# Patient Record
Sex: Male | Born: 1985 | Race: Black or African American | Hispanic: No | Marital: Single | Smoking: Never smoker
Health system: Southern US, Community
[De-identification: ages and names within clinical notes are randomized; demographics above are authoritative.]

## PROBLEM LIST (undated history)

## (undated) DIAGNOSIS — N2889 Other specified disorders of kidney and ureter: Secondary | ICD-10-CM

## (undated) DIAGNOSIS — M109 Gout, unspecified: Secondary | ICD-10-CM

## (undated) DIAGNOSIS — Z87442 Personal history of urinary calculi: Secondary | ICD-10-CM

## (undated) DIAGNOSIS — J069 Acute upper respiratory infection, unspecified: Secondary | ICD-10-CM

## (undated) DIAGNOSIS — R030 Elevated blood-pressure reading, without diagnosis of hypertension: Secondary | ICD-10-CM

## (undated) HISTORY — PX: APPENDECTOMY: SHX54

---

## 2010-11-09 ENCOUNTER — Emergency Department (HOSPITAL_COMMUNITY)
Admission: EM | Admit: 2010-11-09 | Discharge: 2010-11-09 | Disposition: A | Discharge status: Home / Self Care | Payer: Self-pay | Attending: Emergency Medicine | Admitting: Emergency Medicine

## 2010-11-09 DIAGNOSIS — N342 Other urethritis: Secondary | ICD-10-CM | POA: Insufficient documentation

## 2010-11-09 DIAGNOSIS — R369 Urethral discharge, unspecified: Secondary | ICD-10-CM | POA: Insufficient documentation

## 2015-01-02 ENCOUNTER — Encounter (HOSPITAL_COMMUNITY): Payer: Self-pay | Admitting: Emergency Medicine

## 2015-01-02 ENCOUNTER — Emergency Department (HOSPITAL_COMMUNITY)
Admission: EM | Admit: 2015-01-02 | Discharge: 2015-01-03 | Disposition: A | Discharge status: Home / Self Care | Payer: Self-pay | Attending: Emergency Medicine | Admitting: Emergency Medicine

## 2015-01-02 ENCOUNTER — Emergency Department (HOSPITAL_COMMUNITY): Payer: Self-pay

## 2015-01-02 DIAGNOSIS — R200 Anesthesia of skin: Secondary | ICD-10-CM | POA: Insufficient documentation

## 2015-01-02 DIAGNOSIS — R61 Generalized hyperhidrosis: Secondary | ICD-10-CM | POA: Insufficient documentation

## 2015-01-02 DIAGNOSIS — E876 Hypokalemia: Secondary | ICD-10-CM | POA: Insufficient documentation

## 2015-01-02 DIAGNOSIS — R002 Palpitations: Secondary | ICD-10-CM | POA: Insufficient documentation

## 2015-01-02 DIAGNOSIS — T43615A Adverse effect of caffeine, initial encounter: Secondary | ICD-10-CM | POA: Insufficient documentation

## 2015-01-02 DIAGNOSIS — R0602 Shortness of breath: Secondary | ICD-10-CM | POA: Insufficient documentation

## 2015-01-02 DIAGNOSIS — R079 Chest pain, unspecified: Secondary | ICD-10-CM | POA: Insufficient documentation

## 2015-01-02 LAB — CBC
HEMATOCRIT: 40.7 % (ref 39.0–52.0)
HEMOGLOBIN: 14.5 g/dL (ref 13.0–17.0)
MCH: 30 pg (ref 26.0–34.0)
MCHC: 35.6 g/dL (ref 30.0–36.0)
MCV: 84.1 fL (ref 78.0–100.0)
Platelets: 216 10*3/uL (ref 150–400)
RBC: 4.84 MIL/uL (ref 4.22–5.81)
RDW: 12.1 % (ref 11.5–15.5)
WBC: 7.7 10*3/uL (ref 4.0–10.5)

## 2015-01-02 LAB — BASIC METABOLIC PANEL
ANION GAP: 14 (ref 5–15)
BUN: 25 mg/dL — AB (ref 6–20)
CHLORIDE: 106 mmol/L (ref 101–111)
CO2: 17 mmol/L — ABNORMAL LOW (ref 22–32)
Calcium: 9.4 mg/dL (ref 8.9–10.3)
Creatinine, Ser: 1.17 mg/dL (ref 0.61–1.24)
GFR calc Af Amer: >60 mL/min (ref >60)
GLUCOSE: 118 mg/dL — AB (ref 65–99)
POTASSIUM: 2.8 mmol/L — AB (ref 3.5–5.1)
SODIUM: 137 mmol/L (ref 135–145)

## 2015-01-02 LAB — I-STAT TROPONIN, ED: Troponin i, poc: 0 ng/mL (ref 0.00–0.08)

## 2015-01-02 MED ORDER — POTASSIUM CHLORIDE CRYS ER 20 MEQ PO TBCR: 40.0000 meq | EXTENDED_RELEASE_TABLET | Freq: Every day | ORAL | Status: DC

## 2015-01-02 MED ORDER — POTASSIUM CHLORIDE CRYS ER 20 MEQ PO TBCR
40.0000 meq | EXTENDED_RELEASE_TABLET | Freq: Once | ORAL | Status: AC
  Administered 2015-01-02: 40 meq via ORAL
  Filled 2015-01-02: qty 2

## 2015-01-02 MED ORDER — POTASSIUM CHLORIDE CRYS ER 20 MEQ PO TBCR: 20.0000 meq | EXTENDED_RELEASE_TABLET | Freq: Every day | ORAL | Status: DC

## 2015-01-02 MED ORDER — SODIUM CHLORIDE 0.9 % IV BOLUS (SEPSIS)
1000.0000 mL | Freq: Once | INTRAVENOUS | Status: AC
  Administered 2015-01-02: 1000 mL via INTRAVENOUS

## 2015-01-02 MED ORDER — LORAZEPAM 2 MG/ML IJ SOLN
1.0000 mg | Freq: Once | INTRAMUSCULAR | Status: AC
  Administered 2015-01-02: 1 mg via INTRAVENOUS
  Filled 2015-01-02: qty 1

## 2015-01-02 NOTE — ED Provider Notes (Signed)
CSN: [id number]     Arrival date & time 01/02/15  2114 History   First MD Initiated Contact with Patient 01/02/15 2126     Chief Complaint  Patient presents with  . Palpitations     (Consider location/radiation/quality/duration/timing/severity/associated sxs/prior Treatment) HPI  29 year old male presents with chest tightness/pressure as well as palpitations that woke him up about one hour ago. Patient states he has been hyperventilating and feels like he cannot get enough air. He thinks he is having a panic attack. He is having perioral numbness as well as numbness in all 4 extremities. He has taken 2 diet pills (Zantrex) for the first time today. He states they specifically do not have ephedrine. Has never taken diet pills like this before. No radiation of the pain. He has noticed mild diaphoresis but no nausea or vomiting. No prior past medical history. No history of smoking of family history of CAD. No leg swelling, surgeries or recent travel. No illicit drug use.  History reviewed. No pertinent past medical history. Past Surgical History  Procedure Laterality Date  . Appendectomy     Family History  Problem Relation Age of Onset  . Diabetes Father   . Hypertension Other    Social History  Substance Use Topics  . Smoking status: Never Smoker   . Smokeless tobacco: None  . Alcohol Use: Yes     Comment: occ    Review of Systems  Constitutional: Positive for diaphoresis.  Respiratory: Positive for shortness of breath.   Cardiovascular: Positive for chest pain and palpitations. Negative for leg swelling.  Gastrointestinal: Negative for nausea and vomiting.  Neurological: Positive for numbness.  All other systems reviewed and are negative.     Allergies  Review of patient's allergies indicates no known allergies.  Home Medications   Prior to Admission medications   Not on File   BP 144/69 mmHg  Pulse 73  Temp(Src) 97.3 F (36.3 C) (Oral)  Resp 13  SpO2  100% Physical Exam  Constitutional: He is oriented to person, place, and time. He appears well-developed and well-nourished.  HENT:  Head: Normocephalic and atraumatic.  Right Ear: External ear normal.  Left Ear: External ear normal.  Nose: Nose normal.  Eyes: Right eye exhibits no discharge. Left eye exhibits no discharge.  Neck: Neck supple.  Cardiovascular: Normal rate, regular rhythm, normal heart sounds and intact distal pulses.   No murmur heard. Pulmonary/Chest: Effort normal and breath sounds normal. He exhibits no tenderness.  Abdominal: Soft. He exhibits no distension. There is no tenderness.  Musculoskeletal: He exhibits no edema.  Neurological: He is alert and oriented to person, place, and time.  Skin: Skin is warm. He is diaphoretic.  Nursing note and vitals reviewed.   ED Course  Procedures (including critical care time) Labs Review Labs Reviewed  BASIC METABOLIC PANEL - Abnormal; Notable for the following:    Potassium 2.8 (*)    CO2 17 (*)    Glucose, Bld 118 (*)    BUN 25 (*)    All other components within normal limits  CBC  I-STAT TROPOININ, ED    Imaging Review Dg Chest 2 View  01/02/2015  CLINICAL DATA:  Chest tightness for 1 day EXAM: CHEST - 2 VIEW COMPARISON:  None. FINDINGS: The heart size and mediastinal contours are within normal limits. Both lungs are clear. The visualized skeletal structures are unremarkable. IMPRESSION: No active disease. Electronically Signed   By: Inez Catalina M.D.   On: 01/02/2015 22:19  I have personally reviewed and evaluated these images and lab results as part of my medical decision-making.   EKG Interpretation   Date/Time:  Tuesday January 02 2015 21:24:02 EDT Ventricular Rate:  76 PR Interval:  195 QRS Duration: 80 QT Interval:  393 QTC Calculation: 442 R Axis:   70 Text Interpretation:  Sinus rhythm LVH by voltage ST elev, probable normal  early repol pattern no reciprocal changes or acute ischemia No old  tracing  to compare Confirmed by [place]  MD, Ottilia Pippenger ([id number]) on 01/02/2015 9:29:36  PM      MDM   Final diagnoses:  Chest pain, unspecified chest pain type  Caffeine adverse reaction, initial encounter  Hypokalemia    Patient's symptoms of palpitations and chest pain seem to be due to the excessive caffeine in the diet pills he took today. No signs of acute ischemia and I have low suspicion of ACS. Will get a 2nd troponin, if negative, D/C home with PCP f/u. His diffuse numbness is likely from hyperventilation and his hypokalemia. Will replete orally. Discussed stopping the diet pills and discussed strict return precautions. Care to Dr. Randal Buba with 2nd troponin pending.    Sherwood Gambler, MD 01/02/15 [phone]

## 2015-01-02 NOTE — Discharge Instructions (Signed)
Your symptoms seem to be an adverse reaction to the Zantrex you took today. It has a very high caffeine level. There is no evidence of a heart attack, but if you develop recurrent or worsening symptoms return to the ER immediately for evaluation. Otherwise follow up with a primary care doctor like the clinic listed above.

## 2015-01-02 NOTE — ED Notes (Signed)
Pt states he woke up about 30 minutes ago with a heaviness in his chest  Pt states he became very anxious and was hyperventilating  Pt states his hands drew up and his face got drawn  Pt states he took 2 diet pills earlier today  Pt denies pain just a heaviness in his chest

## 2015-01-02 NOTE — ED Notes (Signed)
Patient transported to X-ray 

## 2015-01-03 LAB — I-STAT TROPONIN, ED: Troponin i, poc: 0 ng/mL (ref 0.00–0.08)

## 2018-02-28 ENCOUNTER — Other Ambulatory Visit: Payer: Self-pay

## 2018-02-28 ENCOUNTER — Encounter (HOSPITAL_COMMUNITY): Payer: Self-pay

## 2018-02-28 ENCOUNTER — Emergency Department (HOSPITAL_COMMUNITY)
Admission: EM | Admit: 2018-02-28 | Discharge: 2018-02-28 | Disposition: A | Discharge status: Home / Self Care | Payer: 59 | Attending: Emergency Medicine | Admitting: Emergency Medicine

## 2018-02-28 ENCOUNTER — Emergency Department (HOSPITAL_COMMUNITY): Payer: 59

## 2018-02-28 DIAGNOSIS — R319 Hematuria, unspecified: Secondary | ICD-10-CM | POA: Diagnosis not present

## 2018-02-28 DIAGNOSIS — R1031 Right lower quadrant pain: Secondary | ICD-10-CM | POA: Diagnosis present

## 2018-02-28 DIAGNOSIS — Z79899 Other long term (current) drug therapy: Secondary | ICD-10-CM | POA: Diagnosis not present

## 2018-02-28 LAB — CBC
HEMATOCRIT: 41.2 % (ref 39.0–52.0)
Hemoglobin: 13.2 g/dL (ref 13.0–17.0)
MCH: 29.5 pg (ref 26.0–34.0)
MCHC: 32 g/dL (ref 30.0–36.0)
MCV: 92.2 fL (ref 80.0–100.0)
NRBC: 0 % (ref 0.0–0.2)
Platelets: 237 10*3/uL (ref 150–400)
RBC: 4.47 MIL/uL (ref 4.22–5.81)
RDW: 12.4 % (ref 11.5–15.5)
WBC: 6.3 10*3/uL (ref 4.0–10.5)

## 2018-02-28 LAB — URINALYSIS, ROUTINE W REFLEX MICROSCOPIC
BILIRUBIN URINE: NEGATIVE
GLUCOSE, UA: NEGATIVE mg/dL
Ketones, ur: NEGATIVE mg/dL
LEUKOCYTES UA: NEGATIVE
NITRITE: NEGATIVE
Protein, ur: 30 mg/dL — AB
RBC / HPF: >50 RBC/hpf — ABNORMAL HIGH (ref 0–5)
Specific Gravity, Urine: 1.018 (ref 1.005–1.030)
pH: 5 (ref 5.0–8.0)

## 2018-02-28 LAB — COMPREHENSIVE METABOLIC PANEL
ALT: 34 U/L (ref 0–44)
ANION GAP: 8 (ref 5–15)
AST: 31 U/L (ref 15–41)
Albumin: 4.2 g/dL (ref 3.5–5.0)
Alkaline Phosphatase: 59 U/L (ref 38–126)
BUN: 17 mg/dL (ref 6–20)
CHLORIDE: 111 mmol/L (ref 98–111)
CO2: 24 mmol/L (ref 22–32)
Calcium: 9.1 mg/dL (ref 8.9–10.3)
Creatinine, Ser: 1.18 mg/dL (ref 0.61–1.24)
GFR calc non Af Amer: >60 mL/min (ref >60)
Glucose, Bld: 115 mg/dL — ABNORMAL HIGH (ref 70–99)
POTASSIUM: 4.5 mmol/L (ref 3.5–5.1)
Sodium: 143 mmol/L (ref 135–145)
Total Bilirubin: 0.4 mg/dL (ref 0.3–1.2)
Total Protein: 7.7 g/dL (ref 6.5–8.1)

## 2018-02-28 LAB — LIPASE, BLOOD: LIPASE: 32 U/L (ref 11–51)

## 2018-02-28 NOTE — ED Provider Notes (Signed)
[facility] DEPT Provider Note   CSN: [id number] Arrival date & time: 02/28/18  1016     History   Chief Complaint Chief Complaint  Patient presents with  . Abdominal Pain  . Flank Pain    HPI Richard Brandt is a 32 y.o. male.  HPI Patient is a 32 year old male who reports intermittent hematuria over the past 1 month.  He was seen in urgent care 1 month ago with left flank pain and hematuria and was told that he probably had a kidney stone.  He presents back to the ER today because of a burning sensation once yesterday while voiding and more importantly return of his hematuria which is described as urine that is grapefruit color in nature.  Intermittent flank pain.  No significant pain at this time.  No fevers or chills.  No nausea or vomiting.  Patient has not had any advanced imaging to this point.   History reviewed. No pertinent past medical history.  There are no active problems to display for this patient.   Past Surgical History:  Procedure Laterality Date  . APPENDECTOMY          Home Medications    Prior to Admission medications   Medication Sig Start Date End Date Taking? Authorizing Provider  aspirin-acetaminophen-caffeine (EXCEDRIN MIGRAINE) [phone] MG tablet Take 1 tablet by mouth every 6 (six) hours as needed for headache.   Yes [provider]  Coconut Oil 1000 MG CAPS Take 2,000 mg by mouth daily.   Yes [provider]  potassium chloride SA (K-DUR,KLOR-CON) 20 MEQ tablet Take 1 tablet (20 mEq total) by mouth daily. Patient not taking: Reported on 02/28/2018 01/02/15   Sherwood Gambler, MD    Family History Family History  Problem Relation Age of Onset  . Diabetes Father   . Hypertension Other     Social History Social History   Tobacco Use  . Smoking status: Never Smoker  . Smokeless tobacco: Never Used  Substance Use Topics  . Alcohol use: Yes    Comment: occ  . Drug use: No      Allergies   Patient has no known allergies.   Review of Systems Review of Systems  All other systems reviewed and are negative.    Physical Exam Updated Vital Signs BP (!) 150/92 (BP Location: Left Arm)   Pulse (!) 57   Temp 98.8 F (37.1 C) (Oral)   Resp 16   SpO2 97%   Physical Exam Vitals signs and nursing note reviewed.  Constitutional:      Appearance: He is well-developed.  HENT:     Head: Normocephalic.  Neck:     Musculoskeletal: Normal range of motion.  Pulmonary:     Effort: Pulmonary effort is normal.  Abdominal:     General: There is no distension.     Palpations: There is no mass.     Tenderness: There is no abdominal tenderness.  Musculoskeletal: Normal range of motion.  Neurological:     Mental Status: He is alert and oriented to person, place, and time.      ED Treatments / Results  Labs (all labs ordered are listed, but only abnormal results are displayed) Labs Reviewed  COMPREHENSIVE METABOLIC PANEL - Abnormal; Notable for the following components:      Result Value   Glucose, Bld 115 (*)    All other components within normal limits  URINALYSIS, ROUTINE W REFLEX MICROSCOPIC - Abnormal; Notable for the following components:  APPearance CLOUDY (*)    Hgb urine dipstick LARGE (*)    Protein, ur 30 (*)    RBC / HPF >50 (*)    Bacteria, UA RARE (*)    All other components within normal limits  LIPASE, BLOOD  CBC  URINALYSIS, ROUTINE W REFLEX MICROSCOPIC    EKG None  Radiology Ct Renal Stone Study  Result Date: 02/28/2018 CLINICAL DATA:  Intermittent right lower quadrant abdominal pain which started in the back and is now localized to the right abdomen. Intermittent hematuria yesterday. History of kidney stone. EXAM: CT ABDOMEN AND PELVIS WITHOUT CONTRAST TECHNIQUE: Multidetector CT imaging of the abdomen and pelvis was performed following the standard protocol without IV contrast. COMPARISON:  None. FINDINGS: Lower chest: Clear  lung bases. No significant pleural or pericardial effusion. Hepatobiliary: The liver appears unremarkable as imaged in the noncontrast state. The gallbladder is incompletely distended. No evidence of gallstones, gallbladder wall thickening or biliary dilatation. Pancreas: Unremarkable. No pancreatic ductal dilatation or surrounding inflammatory changes. Spleen: Normal in size without focal abnormality. Adrenals/Urinary Tract: Both adrenal glands appear normal. The right kidney appears unremarkable. There is a 10 mm calculus in the interpolar region of the left kidney, possibly within an infundibulum. There is another tiny calculus more inferiorly. In the lower pole of the left kidney, there is suspicion of a peripherally calcified lesion measuring 2.0 x 2.1 cm on image 25/2. Both ureters appear normal. The bladder is nearly empty and suboptimally evaluated. There are bilateral pelvic phleboliths. Stomach/Bowel: No evidence of bowel wall thickening, distention or surrounding inflammatory change. The appendix is not visualized, by report surgically absent. Mildly prominent stool throughout the colon. Vascular/Lymphatic: There are no enlarged abdominal or pelvic lymph nodes. No significant vascular findings on noncontrast imaging. Reproductive: The prostate gland and seminal vesicles appear normal. Other: Small umbilical hernia containing only fat.  No ascites. Musculoskeletal: No acute or significant osseous findings. IMPRESSION: 1. No acute findings or explanation for the patient's symptoms. 2. Left renal calculi. No evidence of ureteral calculus or hydronephrosis. 3. Indeterminate, suspected partially calcified lesion in the lower pole of the left kidney, incompletely characterized by this noncontrast study. This could reflect a complicated cyst or solid mass. Urology consultation/follow-up recommended. Further evaluation of this lesion would be best obtained with elective renal MRI to include pre and post-contrast  imaging. Alternatively, dedicated renal CT could be performed. 4. Previous appendectomy. Electronically Signed   By: Richardean Sale M.D.   On: 02/28/2018 15:12    Procedures Procedures (including critical care time)  Medications Ordered in ED Medications - No data to display   Initial Impression / Assessment and Plan / ED Course  I have reviewed the triage vital signs and the nursing notes.  Pertinent labs & imaging results that were available during my care of the patient were reviewed by me and considered in my medical decision making (see chart for details).     Calcified left renal mass found on noncontrasted CT.  This could be the cause of the patient's intermittent hematuria over the past month.  He will need outpatient urology follow-up.  He understands to call the office tomorrow morning for close follow-up.  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  Spouse is with the patient as well and all questions were answered.  I do not think he needs additional work-up at this time.  Final Clinical Impressions(s) / ED Diagnoses   Final diagnoses:  Hematuria, unspecified  type    ED Discharge Orders    None       Jola Schmidt, MD 02/28/18 [phone]

## 2018-02-28 NOTE — ED Notes (Signed)
Bed: WA08 Expected date:  Expected time:  Means of arrival:  Comments: 

## 2018-02-28 NOTE — Discharge Instructions (Addendum)
It is VERY important that you follow up with the Urology team for additional workup

## 2018-02-28 NOTE — ED Triage Notes (Signed)
Patient c/o intermittent sore pain in right lower abdomen that started in right lower back and radiated to front. Now pinpoint in right lower abdomen intermitten. Patient noticed blood in urine yesterday intermittent. Patient had a kidney stone about 1 month ago and was seen at urgent care. Patient was able to pass the stone. Patient also has blood in urine a month ago that resolved. Denies n/v or diarrhea. Patient c/o of a burning sensation once yesterday while voiding.   A/Ox4 Ambulatory in triage.   0/10 pain in triage intermittent pain 2/10

## 2018-04-06 ENCOUNTER — Other Ambulatory Visit: Payer: Self-pay | Admitting: Urology

## 2018-04-15 ENCOUNTER — Encounter (HOSPITAL_BASED_OUTPATIENT_CLINIC_OR_DEPARTMENT_OTHER): Payer: Self-pay | Admitting: *Deleted

## 2018-04-15 ENCOUNTER — Other Ambulatory Visit: Payer: Self-pay

## 2018-04-15 NOTE — Progress Notes (Signed)
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

## 2018-04-22 MED ORDER — GENTAMICIN SULFATE 40 MG/ML IJ SOLN
560.0000 mg | INTRAVENOUS | Status: AC
  Administered 2018-04-23: 560 mg via INTRAVENOUS
  Filled 2018-04-22 (×3): qty 14

## 2018-04-22 NOTE — Anesthesia Preprocedure Evaluation (Addendum)
Anesthesia Evaluation  Patient identified by MRN, date of birth, ID band Patient awake    Reviewed: Allergy & Precautions, NPO status , Patient's Chart, lab work & pertinent test results  Airway Mallampati: II  TM Distance: >3 FB Neck ROM: Full    Dental no notable dental hx. (+) Teeth Intact, Dental Advisory Given   Pulmonary neg pulmonary ROS,    Pulmonary exam normal breath sounds clear to auscultation       Cardiovascular Exercise Tolerance: Good negative cardio ROS Normal cardiovascular exam Rhythm:Regular Rate:Normal     Neuro/Psych negative neurological ROS  negative psych ROS   GI/Hepatic negative GI ROS,   Endo/Other    Renal/GU nephrolithiasis     Musculoskeletal   Abdominal   Peds  Hematology   Anesthesia Other Findings   Reproductive/Obstetrics                            Anesthesia Physical Anesthesia Plan  ASA: I  Anesthesia Plan: General   Post-op Pain Management:    Induction: Intravenous  PONV Risk Score and Plan: 2 and Treatment may vary due to age or medical condition, Ondansetron and Dexamethasone  Airway Management Planned: LMA  Additional Equipment:   Intra-op Plan:   Post-operative Plan:   Informed Consent: I have reviewed the patients History and Physical, chart, labs and discussed the procedure including the risks, benefits and alternatives for the proposed anesthesia with the patient or authorized representative who has indicated his/her understanding and acceptance.     Dental advisory given  Plan Discussed with:   Anesthesia Plan Comments: (Cysto w stent L)        Anesthesia Quick Evaluation

## 2018-04-23 ENCOUNTER — Encounter (HOSPITAL_BASED_OUTPATIENT_CLINIC_OR_DEPARTMENT_OTHER): Payer: Self-pay

## 2018-04-23 ENCOUNTER — Encounter (HOSPITAL_BASED_OUTPATIENT_CLINIC_OR_DEPARTMENT_OTHER)
Admission: RE | Disposition: A | Discharge status: Home / Self Care | Payer: Self-pay | Source: Home / Self Care | Attending: Urology

## 2018-04-23 ENCOUNTER — Ambulatory Visit (HOSPITAL_BASED_OUTPATIENT_CLINIC_OR_DEPARTMENT_OTHER)
Admission: RE | Admit: 2018-04-23 | Discharge: 2018-04-23 | Disposition: A | Discharge status: Home / Self Care | Payer: 59 | Attending: Urology | Admitting: Urology

## 2018-04-23 ENCOUNTER — Ambulatory Visit (HOSPITAL_BASED_OUTPATIENT_CLINIC_OR_DEPARTMENT_OTHER): Payer: 59 | Admitting: Anesthesiology

## 2018-04-23 DIAGNOSIS — Z7982 Long term (current) use of aspirin: Secondary | ICD-10-CM | POA: Diagnosis not present

## 2018-04-23 DIAGNOSIS — N2889 Other specified disorders of kidney and ureter: Secondary | ICD-10-CM | POA: Insufficient documentation

## 2018-04-23 DIAGNOSIS — Z87442 Personal history of urinary calculi: Secondary | ICD-10-CM | POA: Insufficient documentation

## 2018-04-23 DIAGNOSIS — Z833 Family history of diabetes mellitus: Secondary | ICD-10-CM | POA: Diagnosis not present

## 2018-04-23 DIAGNOSIS — N200 Calculus of kidney: Secondary | ICD-10-CM | POA: Diagnosis present

## 2018-04-23 DIAGNOSIS — Z8249 Family history of ischemic heart disease and other diseases of the circulatory system: Secondary | ICD-10-CM | POA: Diagnosis not present

## 2018-04-23 HISTORY — DX: Personal history of urinary calculi: Z87.442

## 2018-04-23 HISTORY — PX: CYSTOSCOPY WITH RETROGRADE PYELOGRAM, URETEROSCOPY AND STENT PLACEMENT: SHX5789

## 2018-04-23 HISTORY — DX: Acute upper respiratory infection, unspecified: J06.9

## 2018-04-23 HISTORY — PX: HOLMIUM LASER APPLICATION: SHX5852

## 2018-04-23 LAB — BASIC METABOLIC PANEL
Anion gap: 11 (ref 5–15)
BUN: 18 mg/dL (ref 6–20)
CO2: 26 mmol/L (ref 22–32)
Calcium: 9 mg/dL (ref 8.9–10.3)
Chloride: 103 mmol/L (ref 98–111)
Creatinine, Ser: 1.04 mg/dL (ref 0.61–1.24)
GFR calc Af Amer: >60 mL/min (ref >60)
GFR calc non Af Amer: >60 mL/min (ref >60)
Glucose, Bld: 92 mg/dL (ref 70–99)
POTASSIUM: 3.6 mmol/L (ref 3.5–5.1)
Sodium: 140 mmol/L (ref 135–145)

## 2018-04-23 SURGERY — CYSTOURETEROSCOPY, WITH RETROGRADE PYELOGRAM AND STENT INSERTION
Anesthesia: General | Site: Renal | Laterality: Left

## 2018-04-23 MED ORDER — ONDANSETRON HCL 4 MG/2ML IJ SOLN
4.0000 mg | Freq: Once | INTRAMUSCULAR | Status: DC | PRN
  Filled 2018-04-23: qty 2

## 2018-04-23 MED ORDER — CEFAZOLIN SODIUM-DEXTROSE 1-4 GM/50ML-% IV SOLN
INTRAVENOUS | Status: AC
  Filled 2018-04-23: qty 50

## 2018-04-23 MED ORDER — ONDANSETRON HCL 4 MG/2ML IJ SOLN
INTRAMUSCULAR | Status: DC | PRN
  Administered 2018-04-23: 4 mg via INTRAVENOUS

## 2018-04-23 MED ORDER — IOHEXOL 300 MG/ML  SOLN
INTRAMUSCULAR | Status: DC | PRN
  Administered 2018-04-23: 20 mL

## 2018-04-23 MED ORDER — LIDOCAINE 2% (20 MG/ML) 5 ML SYRINGE
INTRAMUSCULAR | Status: AC
  Filled 2018-04-23: qty 5

## 2018-04-23 MED ORDER — LACTATED RINGERS IV SOLN
INTRAVENOUS | Status: DC
  Administered 2018-04-23 (×2): via INTRAVENOUS
  Filled 2018-04-23: qty 1000

## 2018-04-23 MED ORDER — ACETAMINOPHEN 500 MG PO TABS
ORAL_TABLET | ORAL | Status: AC
  Filled 2018-04-23: qty 2

## 2018-04-23 MED ORDER — KETOROLAC TROMETHAMINE 30 MG/ML IJ SOLN
INTRAMUSCULAR | Status: AC
  Filled 2018-04-23: qty 1

## 2018-04-23 MED ORDER — DEXAMETHASONE SODIUM PHOSPHATE 10 MG/ML IJ SOLN
INTRAMUSCULAR | Status: AC
  Filled 2018-04-23: qty 1

## 2018-04-23 MED ORDER — SODIUM CHLORIDE 0.9 % IR SOLN
Status: DC | PRN
  Administered 2018-04-23: 3000 mL

## 2018-04-23 MED ORDER — KETOROLAC TROMETHAMINE 10 MG PO TABS: 10.0000 mg | ORAL_TABLET | Freq: Three times a day (TID) | ORAL | 0 refills | Status: DC | PRN

## 2018-04-23 MED ORDER — CEPHALEXIN 500 MG PO CAPS: 500.0000 mg | ORAL_CAPSULE | Freq: Two times a day (BID) | ORAL | 0 refills | Status: DC

## 2018-04-23 MED ORDER — MIDAZOLAM HCL 2 MG/2ML IJ SOLN
INTRAMUSCULAR | Status: AC
  Filled 2018-04-23: qty 2

## 2018-04-23 MED ORDER — LIDOCAINE 2% (20 MG/ML) 5 ML SYRINGE
INTRAMUSCULAR | Status: DC | PRN
  Administered 2018-04-23: 100 mg via INTRAVENOUS

## 2018-04-23 MED ORDER — PROPOFOL 10 MG/ML IV BOLUS
INTRAVENOUS | Status: DC | PRN
  Administered 2018-04-23: 200 mg via INTRAVENOUS

## 2018-04-23 MED ORDER — PROPOFOL 10 MG/ML IV BOLUS
INTRAVENOUS | Status: AC
  Filled 2018-04-23: qty 20

## 2018-04-23 MED ORDER — ACETAMINOPHEN 500 MG PO TABS
1000.0000 mg | ORAL_TABLET | Freq: Once | ORAL | Status: AC
  Administered 2018-04-23: 1000 mg via ORAL
  Filled 2018-04-23: qty 2

## 2018-04-23 MED ORDER — FENTANYL CITRATE (PF) 100 MCG/2ML IJ SOLN
INTRAMUSCULAR | Status: DC | PRN
  Administered 2018-04-23 (×4): 50 ug via INTRAVENOUS

## 2018-04-23 MED ORDER — DEXAMETHASONE SODIUM PHOSPHATE 4 MG/ML IJ SOLN
INTRAMUSCULAR | Status: DC | PRN
  Administered 2018-04-23: 10 mg via INTRAVENOUS

## 2018-04-23 MED ORDER — SENNOSIDES-DOCUSATE SODIUM 8.6-50 MG PO TABS: 1.0000 | ORAL_TABLET | Freq: Two times a day (BID) | ORAL | 0 refills | Status: AC

## 2018-04-23 MED ORDER — KETOROLAC TROMETHAMINE 30 MG/ML IJ SOLN
INTRAMUSCULAR | Status: DC | PRN
  Administered 2018-04-23: 30 mg via INTRAVENOUS

## 2018-04-23 MED ORDER — FENTANYL CITRATE (PF) 100 MCG/2ML IJ SOLN
INTRAMUSCULAR | Status: AC
  Filled 2018-04-23: qty 2

## 2018-04-23 MED ORDER — KETOROLAC TROMETHAMINE 30 MG/ML IJ SOLN
30.0000 mg | Freq: Once | INTRAMUSCULAR | Status: DC | PRN
  Filled 2018-04-23: qty 1

## 2018-04-23 MED ORDER — ONDANSETRON HCL 4 MG/2ML IJ SOLN
INTRAMUSCULAR | Status: AC
  Filled 2018-04-23: qty 2

## 2018-04-23 MED ORDER — OXYCODONE-ACETAMINOPHEN 5-325 MG PO TABS: 1.0000 | ORAL_TABLET | Freq: Four times a day (QID) | ORAL | 0 refills | Status: DC | PRN

## 2018-04-23 MED ORDER — CEFAZOLIN SODIUM-DEXTROSE 1-4 GM/50ML-% IV SOLN
INTRAVENOUS | Status: DC | PRN
  Administered 2018-04-23: 1 g via INTRAVENOUS

## 2018-04-23 MED ORDER — HYDROCODONE-ACETAMINOPHEN 7.5-325 MG PO TABS
1.0000 | ORAL_TABLET | Freq: Once | ORAL | Status: DC | PRN
  Filled 2018-04-23: qty 1

## 2018-04-23 MED ORDER — MEPERIDINE HCL 25 MG/ML IJ SOLN
6.2500 mg | INTRAMUSCULAR | Status: DC | PRN
  Filled 2018-04-23: qty 1

## 2018-04-23 MED ORDER — MIDAZOLAM HCL 5 MG/5ML IJ SOLN
INTRAMUSCULAR | Status: DC | PRN
  Administered 2018-04-23: 2 mg via INTRAVENOUS

## 2018-04-23 MED ORDER — HYDROMORPHONE HCL 1 MG/ML IJ SOLN
INTRAMUSCULAR | Status: AC
  Filled 2018-04-23: qty 1

## 2018-04-23 MED ORDER — HYDROMORPHONE HCL 1 MG/ML IJ SOLN
0.2500 mg | INTRAMUSCULAR | Status: DC | PRN
  Administered 2018-04-23 (×2): 0.5 mg via INTRAVENOUS
  Filled 2018-04-23: qty 0.5

## 2018-04-23 SURGICAL SUPPLY — 28 items
BAG DRAIN URO-CYSTO SKYTR STRL (DRAIN) ×3 IMPLANT
BASKET LASER NITINOL 1.9FR (BASKET) ×2 IMPLANT
CATH INTERMIT  6FR 70CM (CATHETERS) IMPLANT
CLOTH BEACON ORANGE TIMEOUT ST (SAFETY) ×3 IMPLANT
FIBER LASER FLEXIVA 365 (UROLOGICAL SUPPLIES) IMPLANT
FIBER LASER TRAC TIP (UROLOGICAL SUPPLIES) ×2 IMPLANT
GLOVE BIO SURGEON STRL SZ7.5 (GLOVE) ×3 IMPLANT
GLOVE BIOGEL PI IND STRL 8.5 (GLOVE) IMPLANT
GLOVE BIOGEL PI INDICATOR 8.5 (GLOVE) ×4
GLOVE INDICATOR 8.5 STRL (GLOVE) ×2 IMPLANT
GOWN STRL REUS W/TWL LRG LVL3 (GOWN DISPOSABLE) ×5 IMPLANT
GUIDEWIRE ANG ZIPWIRE 038X150 (WIRE) ×3 IMPLANT
GUIDEWIRE STR DUAL SENSOR (WIRE) ×3 IMPLANT
INFUSOR MANOMETER BAG 3000ML (MISCELLANEOUS) ×3 IMPLANT
IV NS 1000ML (IV SOLUTION)
IV NS 1000ML BAXH (IV SOLUTION) ×1 IMPLANT
IV NS IRRIG 3000ML ARTHROMATIC (IV SOLUTION) ×3 IMPLANT
KIT TURNOVER CYSTO (KITS) ×3 IMPLANT
MANIFOLD NEPTUNE II (INSTRUMENTS) ×3 IMPLANT
NS IRRIG 500ML POUR BTL (IV SOLUTION) ×4 IMPLANT
PACK CYSTO (CUSTOM PROCEDURE TRAY) ×3 IMPLANT
SHEATH URET ACCESS 12FR/35CM (UROLOGICAL SUPPLIES) ×2 IMPLANT
STENT POLARIS 5FRX28 (STENTS) ×2 IMPLANT
SYR 10ML LL (SYRINGE) ×3 IMPLANT
TUBE CONNECTING 12'X1/4 (SUCTIONS)
TUBE CONNECTING 12X1/4 (SUCTIONS) IMPLANT
TUBE FEEDING 8FR 16IN STR KANG (MISCELLANEOUS) IMPLANT
TUBING UROLOGY SET (TUBING) ×2 IMPLANT

## 2018-04-23 NOTE — Brief Op Note (Deleted)
04/23/2018  11:48 AM  PATIENT:  Cleatrice Burke  33 y.o. male  PRE-OPERATIVE DIAGNOSIS:  LEFT RENAL STONES  POST-OPERATIVE DIAGNOSIS:  LEFT RENAL STONES  PROCEDURE:  Procedure(s): CYSTOSCOPY WITH RETROGRADE PYELOGRAM, URETEROSCOPY AND STENT PLACEMENT (Left) HOLMIUM LASER APPLICATION (Left)  SURGEON:  Surgeon(s) and Role:    Alexis Frock, MD - Primary  PHYSICIAN ASSISTANT:   ASSISTANTS: Case Wood MD   ANESTHESIA:   local and general  EBL:  0 mL   BLOOD ADMINISTERED:none  DRAINS: Penrose drain in the dependant scrotum   LOCAL MEDICATIONS USED:  MARCAINE     SPECIMEN:  No Specimen  DISPOSITION OF SPECIMEN:  N/A  COUNTS:  YES  TOURNIQUET:  * No tourniquets in log *  DICTATION: .Other Dictation: Dictation Number [id number]  PLAN OF CARE: Discharge to home after PACU  PATIENT DISPOSITION:  PACU - hemodynamically stable.   Delay start of Pharmacological VTE agent (>24hrs) due to surgical blood loss or risk of bleeding: yes

## 2018-04-23 NOTE — Transfer of Care (Signed)
  Last Vitals:  Vitals Value Taken Time  BP 143/90 04/23/2018 10:51 AM  Temp    Pulse 79 04/23/2018 10:54 AM  Resp 16 04/23/2018 10:54 AM  SpO2 100 % 04/23/2018 10:54 AM  Vitals shown include unvalidated device data.  Last Pain:  Vitals:   04/23/18 4270  TempSrc:   PainSc: 0-No pain      Patients Stated Pain Goal: 3 (04/23/18 6237)  Immediate Anesthesia Transfer of Care Note  Patient: Richard Brandt  Procedure(s) Performed: Procedure(s) (LRB): CYSTOSCOPY WITH RETROGRADE PYELOGRAM, URETEROSCOPY AND STENT PLACEMENT (Left) HOLMIUM LASER APPLICATION (Left)  Patient Location: PACU  Anesthesia Type: General  Level of Consciousness: awake, alert  and oriented  Airway & Oxygen Therapy: Patient Spontanous Breathing and Patient connected to nasal cannula oxygen  Post-op Assessment: Report given to PACU RN and Post -op Vital signs reviewed and stable  Post vital signs: Reviewed and stable  Complications: No apparent anesthesia complications

## 2018-04-23 NOTE — Op Note (Signed)
NAMEFINNEUS, Richard Brandt MEDICAL RECORD [id number] ACCOUNT [id number] DATE OF BIRTH:[birth year] FACILITY: [facility] LOCATION: [facility] PHYSICIAN:Bonnye Halle, MD  OPERATIVE REPORT  DATE OF PROCEDURE:  04/23/2018  PREOPERATIVE DIAGNOSIS:  Left renal stones, left renal mass.  PROCEDURE: 1.  Cystoscopy, left retrograde pyelogram, interpretation. 2.  Left ureteroscopy with laser lithotripsy. 3.  Insertion of left ureteral stent 5 x 28 Polaris with tether.  ESTIMATED BLOOD LOSS:  Nil.  COMPLICATIONS:  None.  SPECIMENS:  Left renal stone fragments for analysis.  FINDINGS: 1.  Multifocal left renal stones, dominant fragment approximately 8 mm. 2.  Complete resolution of all accessible stone fragments larger than one-third mm following laser lithotripsy and basket extraction. 3.  Successful placement of left ureteral stent proximal end renal pelvis, distal end in urinary bladder.  INDICATIONS:  The patient is a pleasant 33 year old man who was found on workup of gross hematuria to have multifocal left renal stones as well as a concerning left lower pole renal mass.  We discussed management options and are on a path towards  addressing his stones as well as left renal mass with ureteroscopy today in order to render stone free before planned partial nephrectomy likely in several months.  The goal of this is to render him stone free partial nephrectomy, so that he does not  have acute obstruction in the setting of a new renal repair.  Informed consent was obtained and placed in medical record.  DESCRIPTION OF PROCEDURE:  The patient was identified.  Procedure being left ureteroscopic stimulation was confirmed.  Procedure timeout was performed.  Intravenous antibiotics administered.  General anesthesia induced.  The patient was placed into a low  lithotomy position, sterile field was created prepped and draped base of the penis, perineum and proximal thighs using iodine.  Cystourethroscopy was  then performed using a 22-French rigid cystoscope vessel lens.  Inspection of the anterior and  posterior urethra was unremarkable.  Inspection of the urinary bladder no diverticula, calcifications, papillary lesions.  The left ureteral orifice was cannulated with a 6-French renal catheter and left retrograde pyelogram was obtained.  Left retrograde pyelogram demonstrates a single left ureter single system of the kidney.  There were no filling defects or narrowing noted.  A 0.03 ZIPwire was advanced to lower pole and set aside as a safety wire.  An 8-French feeding tube was placed in  the urinary bladder pressure released and semi-rigid ureteroscopy was performed of the distal 2/3 left ureter alongside a separate sensor working wire.  No mucosal abnormalities were found.  The semirigid scope was exchanged for a 12/14 medium length  ureteral access sheath to the level of the proximal ureter using continuous fluoroscopic guidance and flexible digital ureteroscopy performed the proximal left ureter and left kidney, including all calices x3.  There was multifocal intrarenal stone.   There was a dominant calcification approximately 8 mm in the area of the renal pelvis and UPJ.  This was retrograde positioned into the upper pole calix allowing for minimal angulation for stone manipulation.  There was another smaller stone  approximately 3 mm that was amenable to simple basketing.  It was grasped with an escape basket, removed and set aside.  Holmium laser energy was then applied to the dominant stone using settings of 0.2 joules and 20 Hz, fragmented into 4 smaller pieces  that were then sequentially grasped with the long axis, removed and set aside for composition analysis.  Following this there was excellent hemostasis.  No evidence of renal perforation.  There was complete resolution of all accessible stone fragments  larger than one-third mm that.  The access sheath has been under continuous vision.  No  significant mucosal abnormalities or edema noted.  As such, a new 5 x 28 Polaris-type stent was placed with remaining safety wire using fluoroscopic guidance.  Good  proximal and distal plane were noted.  Tether was left in place fashioned to the dorsum of the penis.  The patient tolerated the procedure well.  No immediate perioperative complications.  The patient was taken to postanesthesia care in stable condition.  TN/NUANCE  D:04/23/2018 T:04/23/2018 JOB:[id number]

## 2018-04-23 NOTE — Anesthesia Postprocedure Evaluation (Signed)
Anesthesia Post Note  Patient: Richard Brandt  Procedure(s) Performed: CYSTOSCOPY WITH RETROGRADE PYELOGRAM, URETEROSCOPY AND STENT PLACEMENT (Left Renal) HOLMIUM LASER APPLICATION (Left Renal)     Patient location during evaluation: PACU Anesthesia Type: General Level of consciousness: awake and alert Pain management: pain level controlled Vital Signs Assessment: post-procedure vital signs reviewed and stable Respiratory status: spontaneous breathing, nonlabored ventilation, respiratory function stable and patient connected to nasal cannula oxygen Cardiovascular status: blood pressure returned to baseline and stable Postop Assessment: no apparent nausea or vomiting Anesthetic complications: no    Last Vitals:  Vitals:   04/23/18 1115 04/23/18 1130  BP: (!) 151/88 139/82  Pulse: 69 64  Resp: 13 11  Temp:    SpO2: 97% 100%    Last Pain:  Vitals:   04/23/18 1131  TempSrc:   PainSc: 5                  Barnet Glasgow

## 2018-04-23 NOTE — Brief Op Note (Signed)
04/23/2018  10:41 AM  PATIENT:  Cleatrice Burke  33 y.o. male  PRE-OPERATIVE DIAGNOSIS:  LEFT RENAL STONES  POST-OPERATIVE DIAGNOSIS:  LEFT RENAL STONES  PROCEDURE:  Procedure(s): CYSTOSCOPY WITH RETROGRADE PYELOGRAM, URETEROSCOPY AND STENT PLACEMENT (Left) HOLMIUM LASER APPLICATION (Left)  SURGEON:  Surgeon(s) and Role:    Alexis Frock, MD - Primary  PHYSICIAN ASSISTANT:   ASSISTANTS: none   ANESTHESIA:   general  EBL:  0 mL   BLOOD ADMINISTERED:none  DRAINS: none   LOCAL MEDICATIONS USED:  NONE  SPECIMEN:  Source of Specimen:  left renal stone fragments  DISPOSITION OF SPECIMEN:  [facility] for compositional analysis  COUNTS:  YES  TOURNIQUET:  * No tourniquets in log *  DICTATION: .Other Dictation: Dictation Number [id number]  PLAN OF CARE: Discharge to home after PACU  PATIENT DISPOSITION:  PACU - hemodynamically stable.   Delay start of Pharmacological VTE agent (>24hrs) due to surgical blood loss or risk of bleeding: yes

## 2018-04-23 NOTE — Discharge Instructions (Signed)
1 - You may have urinary urgency (bladder spasms) and bloody urine on / off with stent in place. This is normal.  2 - Remove tethered stent on Monday morning at home by pulling on string, then blue-white plastic tubing, and discarding. Office is open Monday if any problems arise.   3 - Call MD or go to ER for fever >102, severe pain / nausea / vomiting not relieved by medications, or acute change in medical status   Post Anesthesia Home Care Instructions  Activity: Get plenty of rest for the remainder of the day. A responsible individual must stay with you for 24 hours following the procedure.  For the next 24 hours, DO NOT: -Drive a car -Paediatric nurse -Drink alcoholic beverages -Take any medication unless instructed by your physician -Make any legal decisions or sign important papers.  Meals: Start with liquid foods such as gelatin or soup. Progress to regular foods as tolerated. Avoid greasy, spicy, heavy foods. If nausea and/or vomiting occur, drink only clear liquids until the nausea and/or vomiting subsides. Call your physician if vomiting continues.  Special Instructions/Symptoms: Your throat may feel dry or sore from the anesthesia or the breathing tube placed in your throat during surgery. If this causes discomfort, gargle with warm salt water. The discomfort should disappear within 24 hours.  [facility] [phone] Post Ureteroscopy With or Without Stent Instructions  Definitions:  Ureter: The duct that transports urine from the kidney to the bladder. Stent:   A plastic hollow tube that is placed into the ureter, from the kidney to the                 bladder to prevent the ureter from swelling shut.  GENERAL INSTRUCTIONS:  Despite the fact that no skin incisions were used, the area around the ureter and bladder is raw and irritated. The stent is a foreign body which will further irritate the bladder wall. This irritation is manifested by increased  frequency of urination, both day and night, and by an increase in the urge to urinate. In some, the urge to urinate is present almost always. Sometimes the urge is strong enough that you may not be able to stop yourself from urinating. The only real cure is to remove the stent and then give time for the bladder wall to heal which can't be done until the danger of the ureter swelling shut has passed, which varies.  You may see some blood in your urine while the stent is in place and a few days afterwards. Do not be alarmed, even if the urine was clear for a while. Get off your feet and drink lots of fluids until clearing occurs. If you start to pass clots or don't improve, call us.  DIET: You may return to your normal diet immediately. Because of the raw surface of your bladder, alcohol, spicy foods, acid type foods and drinks with caffeine may cause irritation or frequency and should be used in moderation. To keep your urine flowing freely and to avoid constipation, drink plenty of fluids during the day ( 8-10 glasses ). Tip: Avoid cranberry juice because it is very acidic.  ACTIVITY: Your physical activity doesn't need to be restricted. However, if you are very active, you may see some blood in your urine. We suggest that you reduce your activity under these circumstances until the bleeding has stopped.  BOWELS: It is important to keep your bowels regular during the postoperative period. Straining with bowel movements can  cause bleeding. A bowel movement every other day is reasonable. Use a mild laxative if needed, such as Milk of Magnesia 2-3 tablespoons, or 2 Dulcolax tablets. Call if you continue to have problems. If you have been taking narcotics for pain, before, during or after your surgery, you may be constipated. Take a laxative if necessary.   MEDICATION: You should resume your pre-surgery medications unless told not to. In addition you will often be given an antibiotic to prevent  infection. These should be taken as prescribed until the bottles are finished unless you are having an unusual reaction to one of the drugs.  PROBLEMS YOU SHOULD REPORT TO [place]:  Fevers over 100.5 Fahrenheit.  Heavy bleeding, or clots ( See above notes about blood in urine ).  Inability to urinate.  Drug reactions ( hives, rash, nausea, vomiting, diarrhea ).  Severe burning or pain with urination that is not improving.  FOLLOW-UP: You will need a follow-up appointment to monitor your progress. Call for this appointment at the number listed above. Usually the first appointment will be about three to fourteen days after your surgery.

## 2018-04-23 NOTE — Anesthesia Procedure Notes (Signed)
Procedure Name: LMA Insertion Date/Time: 04/23/2018 9:59 AM Performed by: Barnet Glasgow, MD Pre-anesthesia Checklist: Patient identified, Emergency Drugs available, Suction available and Patient being monitored Patient Re-evaluated:Patient Re-evaluated prior to induction Oxygen Delivery Method: Circle system utilized Preoxygenation: Pre-oxygenation with 100% oxygen Induction Type: IV induction Ventilation: Mask ventilation without difficulty LMA: LMA inserted LMA Size: 5.0 Number of attempts: 1 Airway Equipment and Method: Bite block Placement Confirmation: positive ETCO2 Tube secured with: Tape Dental Injury: Teeth and Oropharynx as per pre-operative assessment

## 2018-04-23 NOTE — H&P (Signed)
Richard Brandt is an 33 y.o. male.    Chief Complaint: Pre-op LEFT Ureteroscopic Stone Manipulation  HPI:   1 - Gross Hematuria - few months of on / off visible blood in urine late 2019. Non-smoker. No chronic solvent exposure. Few stoens and ? left renal mass as per below. UA without infectious parameters. Cr 1.2 02/2018.   2 - Nephrolithiasis - passed small left ureteral stone late 2019 medically. CT after passage with left mid 61mm and 37mm non-obstructing pap tip calcifications.   3 - Left Renal Mass - 2.4cm calcificed parenchymanl density left lower pole by ER non-con CT 02-2018 concerning for possible small renal cancer. No h/o sickle disease / trait. Dedicated contrast scan 03/2018 with enhancing lower pole mass 90% endophytic. 1 artery / 2 vein left renovascular anatomy.   PMH sig for open appy. 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 NO CV disease / blood thinners.   Today "Richard Brandt" is seen to proceed with LEFT ureteroscopic stone manipulation with goal of stone free prior to upcoming left renal cancer surgery. NO interval fevers.    Past Medical History:  Diagnosis Date  . History of kidney stones   . URI (upper respiratory infection)    dry cough last few months on amoicillian and prednisone    Past Surgical History:  Procedure Laterality Date  . APPENDECTOMY  age 59 or 23    Family History  Problem Relation Age of Onset  . Diabetes Father   . Hypertension Other    Social History:  reports that he has never smoked. He has never used smokeless tobacco. He reports current alcohol use. He reports that he does not use drugs.  Allergies: No Known Allergies  Medications Prior to Admission  Medication Sig Dispense Refill  . amoxicillin-clavulanate (AUGMENTIN) 875-125 MG tablet Take 1 tablet by mouth 2 (two) times daily. For uri started Tuesday 05-14-18    . aspirin-acetaminophen-caffeine (EXCEDRIN MIGRAINE) 250-250-65 MG  tablet Take 1 tablet by mouth every 6 (six) hours as needed for headache.    . predniSONE (DELTASONE) 10 MG tablet Take 10 mg by mouth daily with breakfast. Started Tuesday 05-14-18  2 capsules in am , 1 capsule at lunch, 1 capsule in pm, take 2 capsules at hs      No results found for this or any previous visit (from the past 48 hour(s)). No results found.  Review of Systems  Constitutional: Negative.  Negative for chills and fever.  HENT: Negative.   Eyes: Negative.   Respiratory: Negative.   Cardiovascular: Negative.   Gastrointestinal: Negative.   Genitourinary: Negative for dysuria and urgency.  Musculoskeletal: Negative.   Skin: Negative.   Neurological: Negative.   Endo/Heme/Allergies: Negative.   Psychiatric/Behavioral: Negative.     Blood pressure (!) 165/103, pulse 76, temperature 98 F (36.7 C), temperature source Oral, resp. rate 18, height 6\' 8"  (2.032 m), weight 134.1 kg, SpO2 100 %. Physical Exam  Constitutional: He appears well-developed.  HENT:  Head: Normocephalic.  Eyes: Pupils are equal, round, and reactive to light.  Neck: Normal range of motion.  Cardiovascular: Normal rate.  Respiratory: Effort normal.  GI: Soft.  Genitourinary:    Genitourinary Comments: Minimal CVAT at present.    Musculoskeletal: Normal range of motion.  Neurological: He is alert.  Skin: Skin is warm.     Assessment/Plan  Proceed as planned with LEFT ureteroscopic stone manipulation. Risks, benefits, expected peri-op course discussed previously and reiterated  today.   Alexis Frock, MD 04/23/2018, 9:11 AM

## 2018-04-26 ENCOUNTER — Encounter (HOSPITAL_BASED_OUTPATIENT_CLINIC_OR_DEPARTMENT_OTHER): Payer: Self-pay | Admitting: Urology

## 2018-05-13 ENCOUNTER — Other Ambulatory Visit: Payer: Self-pay | Admitting: Urology

## 2018-05-26 NOTE — Patient Instructions (Addendum)
Richard Brandt  05/26/2018   Your procedure is scheduled on: 06-02-2018   Report to [facility] Main  Entrance     Report to admitting at 10:00AM    Call this number if you have problems the morning of surgery [phone]       Remember: [place]. Do not eat food or drink liquids :After Midnight. BRUSH YOUR TEETH MORNING OF SURGERY AND RINSE YOUR MOUTH OUT, NO CHEWING GUM CANDY OR MINTS.      Take these medicines the morning of surgery with A SIP OF WATER: NONE                                You may not have any metal on your body including hair pins and              piercings  Do not wear jewelry, make-up, lotions, powders or perfumes, deodorant                Men may shave face and neck.   Do not bring valuables to the hospital. [place].  Contacts, dentures or bridgework may not be worn into surgery.  Leave suitcase in the car. After surgery it may be brought to your room.                  Please read over the following fact sheets you were given: _____________________________________________________________________             [facility] - Preparing for Surgery Before surgery, you can play an important role.  Because skin is not sterile, your skin needs to be as free of germs as possible.  You can reduce the number of germs on your skin by washing with CHG (chlorahexidine gluconate) soap before surgery.  CHG is an antiseptic cleaner which kills germs and bonds with the skin to continue killing germs even after washing. Please DO NOT use if you have an allergy to CHG or antibacterial soaps.  If your skin becomes reddened/irritated stop using the CHG and inform your nurse when you arrive at Short Stay. Do not shave (including legs and underarms) for at least 48 hours prior to the first CHG shower.  You may shave your face/neck. Please  follow these instructions carefully:  1.  Shower with CHG Soap the night before surgery and the  morning of Surgery.  2.  If you choose to wash your hair, wash your hair first as usual with your  normal  shampoo.  3.  After you shampoo, rinse your hair and body thoroughly to remove the  shampoo.                           4.  Use CHG as you would any other liquid soap.  You can apply chg directly  to the skin and wash                       Gently with a scrungie or clean washcloth.  5.  Apply the CHG Soap to your body ONLY FROM THE NECK DOWN.   Do not use on face/ open  Wound or open sores. Avoid contact with eyes, ears mouth and genitals (private parts).                       Wash face,  Genitals (private parts) with your normal soap.             6.  Wash thoroughly, paying special attention to the area where your surgery  will be performed.  7.  Thoroughly rinse your body with warm water from the neck down.  8.  DO NOT shower/wash with your normal soap after using and rinsing off  the CHG Soap.                9.  Pat yourself dry with a clean towel.            10.  Wear clean pajamas.            11.  Place clean sheets on your bed the night of your first shower and do not  sleep with pets. Day of Surgery : Do not apply any lotions/deodorants the morning of surgery.  Please wear clean clothes to the hospital/surgery center.  FAILURE TO FOLLOW THESE INSTRUCTIONS MAY RESULT IN THE CANCELLATION OF YOUR SURGERY PATIENT SIGNATURE_________________________________  NURSE SIGNATURE__________________________________  ________________________________________________________________________

## 2018-05-28 ENCOUNTER — Encounter (HOSPITAL_COMMUNITY)
Admission: RE | Admit: 2018-05-28 | Discharge: 2018-05-28 | Disposition: A | Discharge status: Home / Self Care | Payer: 59 | Source: Ambulatory Visit | Attending: Urology | Admitting: Urology

## 2018-05-28 ENCOUNTER — Encounter (INDEPENDENT_AMBULATORY_CARE_PROVIDER_SITE_OTHER): Payer: Self-pay

## 2018-05-28 ENCOUNTER — Other Ambulatory Visit: Payer: Self-pay

## 2018-05-28 ENCOUNTER — Encounter (HOSPITAL_COMMUNITY): Payer: Self-pay

## 2018-05-28 DIAGNOSIS — C642 Malignant neoplasm of left kidney, except renal pelvis: Secondary | ICD-10-CM | POA: Insufficient documentation

## 2018-05-28 DIAGNOSIS — Z01812 Encounter for preprocedural laboratory examination: Secondary | ICD-10-CM | POA: Insufficient documentation

## 2018-05-28 HISTORY — DX: Elevated blood-pressure reading, without diagnosis of hypertension: R03.0

## 2018-05-28 HISTORY — DX: Other specified disorders of kidney and ureter: N28.89

## 2018-05-28 LAB — BASIC METABOLIC PANEL
Anion gap: 10 (ref 5–15)
BUN: 19 mg/dL (ref 6–20)
CO2: 25 mmol/L (ref 22–32)
CREATININE: 1.26 mg/dL — AB (ref 0.61–1.24)
Calcium: 9.3 mg/dL (ref 8.9–10.3)
Chloride: 108 mmol/L (ref 98–111)
GFR calc Af Amer: >60 mL/min (ref >60)
GFR calc non Af Amer: >60 mL/min (ref >60)
Glucose, Bld: 105 mg/dL — ABNORMAL HIGH (ref 70–99)
Potassium: 4.2 mmol/L (ref 3.5–5.1)
Sodium: 143 mmol/L (ref 135–145)

## 2018-05-28 LAB — CBC
HCT: 43 % (ref 39.0–52.0)
Hemoglobin: 13.6 g/dL (ref 13.0–17.0)
MCH: 29.1 pg (ref 26.0–34.0)
MCHC: 31.6 g/dL (ref 30.0–36.0)
MCV: 91.9 fL (ref 80.0–100.0)
PLATELETS: 206 10*3/uL (ref 150–400)
RBC: 4.68 MIL/uL (ref 4.22–5.81)
RDW: 12.2 % (ref 11.5–15.5)
WBC: 4.6 10*3/uL (ref 4.0–10.5)
nRBC: 0 % (ref 0.0–0.2)

## 2018-05-28 MED ORDER — MAGNESIUM CITRATE PO SOLN
1.0000 | Freq: Once | ORAL | Status: DC
  Filled 2018-05-28: qty 296

## 2018-06-01 MED ORDER — DEXTROSE 5 % IV SOLN
3.0000 g | INTRAVENOUS | Status: AC
  Administered 2018-06-02: 3 g via INTRAVENOUS
  Filled 2018-06-01: qty 3

## 2018-06-01 NOTE — Progress Notes (Signed)
Patient denies symptoms of COVID 19. He is instructed that only one family member can come with him.

## 2018-06-02 ENCOUNTER — Other Ambulatory Visit: Payer: Self-pay

## 2018-06-02 ENCOUNTER — Encounter (HOSPITAL_COMMUNITY)
Admission: RE | Disposition: A | Discharge status: Home / Self Care | Payer: Self-pay | Source: Home / Self Care | Attending: Urology

## 2018-06-02 ENCOUNTER — Inpatient Hospital Stay (HOSPITAL_COMMUNITY): Payer: 59 | Admitting: Certified Registered Nurse Anesthetist

## 2018-06-02 ENCOUNTER — Encounter (HOSPITAL_COMMUNITY): Payer: Self-pay | Admitting: Certified Registered Nurse Anesthetist

## 2018-06-02 ENCOUNTER — Inpatient Hospital Stay (HOSPITAL_COMMUNITY)
Admission: RE | Admit: 2018-06-02 | Discharge: 2018-06-03 | DRG: 658 | Disposition: A | Discharge status: Home / Self Care | Payer: 59 | Attending: Urology | Admitting: Urology

## 2018-06-02 ENCOUNTER — Inpatient Hospital Stay (HOSPITAL_COMMUNITY): Payer: 59 | Admitting: Physician Assistant

## 2018-06-02 DIAGNOSIS — Z6831 Body mass index (BMI) 31.0-31.9, adult: Secondary | ICD-10-CM | POA: Diagnosis not present

## 2018-06-02 DIAGNOSIS — N2889 Other specified disorders of kidney and ureter: Secondary | ICD-10-CM | POA: Diagnosis present

## 2018-06-02 DIAGNOSIS — C642 Malignant neoplasm of left kidney, except renal pelvis: Secondary | ICD-10-CM | POA: Diagnosis present

## 2018-06-02 DIAGNOSIS — E669 Obesity, unspecified: Secondary | ICD-10-CM | POA: Diagnosis present

## 2018-06-02 DIAGNOSIS — Z87442 Personal history of urinary calculi: Secondary | ICD-10-CM

## 2018-06-02 DIAGNOSIS — Z833 Family history of diabetes mellitus: Secondary | ICD-10-CM | POA: Diagnosis not present

## 2018-06-02 DIAGNOSIS — Z8249 Family history of ischemic heart disease and other diseases of the circulatory system: Secondary | ICD-10-CM | POA: Diagnosis not present

## 2018-06-02 HISTORY — PX: ROBOTIC ASSITED PARTIAL NEPHRECTOMY: SHX6087

## 2018-06-02 LAB — HEMOGLOBIN AND HEMATOCRIT, BLOOD
HCT: 41.6 % (ref 39.0–52.0)
Hemoglobin: 13.8 g/dL (ref 13.0–17.0)

## 2018-06-02 LAB — ABO/RH: ABO/RH(D): O POS

## 2018-06-02 SURGERY — NEPHRECTOMY, PARTIAL, ROBOT-ASSISTED
Anesthesia: General | Laterality: Left

## 2018-06-02 MED ORDER — DEXTROSE-NACL 5-0.45 % IV SOLN
INTRAVENOUS | Status: DC
  Administered 2018-06-02 – 2018-06-03 (×2): via INTRAVENOUS

## 2018-06-02 MED ORDER — ONDANSETRON HCL 4 MG/2ML IJ SOLN: 4.0000 mg | INTRAMUSCULAR | Status: DC | PRN

## 2018-06-02 MED ORDER — HYDROMORPHONE HCL 1 MG/ML IJ SOLN
0.5000 mg | INTRAMUSCULAR | Status: DC | PRN
  Administered 2018-06-02: 1 mg via INTRAVENOUS
  Filled 2018-06-02: qty 1

## 2018-06-02 MED ORDER — PROPOFOL 10 MG/ML IV BOLUS
INTRAVENOUS | Status: AC
  Filled 2018-06-02: qty 20

## 2018-06-02 MED ORDER — MIDAZOLAM HCL 2 MG/2ML IJ SOLN
INTRAMUSCULAR | Status: DC | PRN
  Administered 2018-06-02: 2 mg via INTRAVENOUS

## 2018-06-02 MED ORDER — OXYCODONE HCL 5 MG/5ML PO SOLN: 5.0000 mg | Freq: Once | ORAL | Status: DC | PRN

## 2018-06-02 MED ORDER — ROCURONIUM BROMIDE 100 MG/10ML IV SOLN
INTRAVENOUS | Status: DC | PRN
  Administered 2018-06-02: 20 mg via INTRAVENOUS
  Administered 2018-06-02: 30 mg via INTRAVENOUS
  Administered 2018-06-02: 20 mg via INTRAVENOUS
  Administered 2018-06-02: 50 mg via INTRAVENOUS

## 2018-06-02 MED ORDER — PHENYLEPHRINE HCL 10 MG/ML IJ SOLN
INTRAMUSCULAR | Status: DC | PRN
  Administered 2018-06-02: 40 ug via INTRAVENOUS

## 2018-06-02 MED ORDER — FENTANYL CITRATE (PF) 100 MCG/2ML IJ SOLN
INTRAMUSCULAR | Status: DC | PRN
  Administered 2018-06-02 (×2): 100 ug via INTRAVENOUS

## 2018-06-02 MED ORDER — HYDROMORPHONE HCL 1 MG/ML IJ SOLN
INTRAMUSCULAR | Status: AC
  Filled 2018-06-02: qty 1

## 2018-06-02 MED ORDER — OXYCODONE HCL 5 MG PO TABS: 5.0000 mg | ORAL_TABLET | Freq: Once | ORAL | Status: DC | PRN

## 2018-06-02 MED ORDER — LABETALOL HCL 5 MG/ML IV SOLN
10.0000 mg | Freq: Once | INTRAVENOUS | Status: AC
  Administered 2018-06-02: 10 mg via INTRAVENOUS

## 2018-06-02 MED ORDER — OXYCODONE-ACETAMINOPHEN 5-325 MG PO TABS: 1.0000 | ORAL_TABLET | Freq: Four times a day (QID) | ORAL | 0 refills | Status: AC | PRN

## 2018-06-02 MED ORDER — LABETALOL HCL 5 MG/ML IV SOLN
INTRAVENOUS | Status: AC
  Filled 2018-06-02: qty 4

## 2018-06-02 MED ORDER — LIDOCAINE HCL (CARDIAC) PF 100 MG/5ML IV SOSY
PREFILLED_SYRINGE | INTRAVENOUS | Status: DC | PRN
  Administered 2018-06-02: 100 mg via INTRAVENOUS

## 2018-06-02 MED ORDER — HYDROMORPHONE HCL 1 MG/ML IJ SOLN
0.2500 mg | INTRAMUSCULAR | Status: DC | PRN
  Administered 2018-06-02 (×3): 0.5 mg via INTRAVENOUS

## 2018-06-02 MED ORDER — FENTANYL CITRATE (PF) 100 MCG/2ML IJ SOLN
INTRAMUSCULAR | Status: AC
  Filled 2018-06-02: qty 2

## 2018-06-02 MED ORDER — OXYCODONE HCL 5 MG PO TABS
5.0000 mg | ORAL_TABLET | ORAL | Status: DC | PRN
  Administered 2018-06-03: 5 mg via ORAL
  Filled 2018-06-02: qty 1

## 2018-06-02 MED ORDER — SODIUM CHLORIDE (PF) 0.9 % IJ SOLN
INTRAMUSCULAR | Status: AC
  Filled 2018-06-02: qty 20

## 2018-06-02 MED ORDER — PROPOFOL 10 MG/ML IV BOLUS
INTRAVENOUS | Status: DC | PRN
  Administered 2018-06-02: 200 mg via INTRAVENOUS
  Administered 2018-06-02: 100 mg via INTRAVENOUS

## 2018-06-02 MED ORDER — LACTATED RINGERS IR SOLN
Status: DC | PRN
  Administered 2018-06-02: 1

## 2018-06-02 MED ORDER — DEXAMETHASONE SODIUM PHOSPHATE 4 MG/ML IJ SOLN
INTRAMUSCULAR | Status: DC | PRN
  Administered 2018-06-02: 4 mg via INTRAVENOUS

## 2018-06-02 MED ORDER — LACTATED RINGERS IV SOLN
INTRAVENOUS | Status: DC | PRN
  Administered 2018-06-02 (×2): via INTRAVENOUS

## 2018-06-02 MED ORDER — DIPHENHYDRAMINE HCL 12.5 MG/5ML PO ELIX: 12.5000 mg | ORAL_SOLUTION | Freq: Four times a day (QID) | ORAL | Status: DC | PRN

## 2018-06-02 MED ORDER — ACETAMINOPHEN 500 MG PO TABS
1000.0000 mg | ORAL_TABLET | Freq: Four times a day (QID) | ORAL | Status: AC
  Administered 2018-06-02 – 2018-06-03 (×4): 1000 mg via ORAL
  Filled 2018-06-02 (×4): qty 2

## 2018-06-02 MED ORDER — METHOCARBAMOL 500 MG IVPB - SIMPLE MED
INTRAVENOUS | Status: AC
  Filled 2018-06-02: qty 50

## 2018-06-02 MED ORDER — MIDAZOLAM HCL 2 MG/2ML IJ SOLN
INTRAMUSCULAR | Status: AC
  Filled 2018-06-02: qty 2

## 2018-06-02 MED ORDER — BUPIVACAINE LIPOSOME 1.3 % IJ SUSP
20.0000 mL | Freq: Once | INTRAMUSCULAR | Status: AC
  Administered 2018-06-02: 20 mL
  Filled 2018-06-02: qty 20

## 2018-06-02 MED ORDER — PROMETHAZINE HCL 25 MG/ML IJ SOLN: 6.2500 mg | INTRAMUSCULAR | Status: DC | PRN

## 2018-06-02 MED ORDER — LACTATED RINGERS IV SOLN
INTRAVENOUS | Status: DC
  Administered 2018-06-02: 11:00:00 via INTRAVENOUS

## 2018-06-02 MED ORDER — STERILE WATER FOR IRRIGATION IR SOLN
Status: DC | PRN
  Administered 2018-06-02: 1000 mL

## 2018-06-02 MED ORDER — DIPHENHYDRAMINE HCL 50 MG/ML IJ SOLN: 12.5000 mg | Freq: Four times a day (QID) | INTRAMUSCULAR | Status: DC | PRN

## 2018-06-02 MED ORDER — BACITRACIN-NEOMYCIN-POLYMYXIN 400-5-5000 EX OINT: 1.0000 "application " | TOPICAL_OINTMENT | Freq: Three times a day (TID) | CUTANEOUS | Status: DC | PRN

## 2018-06-02 MED ORDER — SUGAMMADEX SODIUM 200 MG/2ML IV SOLN
INTRAVENOUS | Status: DC | PRN
  Administered 2018-06-02: 300 mg via INTRAVENOUS

## 2018-06-02 MED ORDER — ONDANSETRON HCL 4 MG/2ML IJ SOLN
INTRAMUSCULAR | Status: DC | PRN
  Administered 2018-06-02: 4 mg via INTRAVENOUS

## 2018-06-02 MED ORDER — BELLADONNA ALKALOIDS-OPIUM 16.2-60 MG RE SUPP: 1.0000 | Freq: Four times a day (QID) | RECTAL | Status: DC | PRN

## 2018-06-02 MED ORDER — SODIUM CHLORIDE (PF) 0.9 % IJ SOLN
INTRAMUSCULAR | Status: DC | PRN
  Administered 2018-06-02: 20 mL

## 2018-06-02 SURGICAL SUPPLY — 70 items
APPLICATOR SURGIFLO ENDO (HEMOSTASIS) ×3 IMPLANT
CHLORAPREP W/TINT 26ML (MISCELLANEOUS) ×3 IMPLANT
CLIP SUT LAPRA TY ABSORB (SUTURE) ×3 IMPLANT
CLIP VESOLOCK LG 6/CT PURPLE (CLIP) ×3 IMPLANT
CLIP VESOLOCK MED LG 6/CT (CLIP) ×21 IMPLANT
CLIP VESOLOCK XL 6/CT (CLIP) IMPLANT
COVER SURGICAL LIGHT HANDLE (MISCELLANEOUS) ×3 IMPLANT
COVER TIP SHEARS 8 DVNC (MISCELLANEOUS) ×1 IMPLANT
COVER TIP SHEARS 8MM DA VINCI (MISCELLANEOUS) ×2
COVER WAND RF STERILE (DRAPES) IMPLANT
DECANTER SPIKE VIAL GLASS SM (MISCELLANEOUS) ×3 IMPLANT
DERMABOND ADVANCED (GAUZE/BANDAGES/DRESSINGS) ×2
DERMABOND ADVANCED .7 DNX12 (GAUZE/BANDAGES/DRESSINGS) ×1 IMPLANT
DRAIN CHANNEL 15F RND FF 3/16 (WOUND CARE) ×3 IMPLANT
DRAPE ARM DVNC X/XI (DISPOSABLE) ×4 IMPLANT
DRAPE COLUMN DVNC XI (DISPOSABLE) ×1 IMPLANT
DRAPE DA VINCI XI ARM (DISPOSABLE) ×8
DRAPE DA VINCI XI COLUMN (DISPOSABLE) ×2
DRAPE INCISE IOBAN 66X45 STRL (DRAPES) ×3 IMPLANT
DRAPE SHEET LG 3/4 BI-LAMINATE (DRAPES) ×3 IMPLANT
DRSG TEGADERM 4X4.75 (GAUZE/BANDAGES/DRESSINGS) ×3 IMPLANT
ELECT PENCIL ROCKER SW 15FT (MISCELLANEOUS) ×3 IMPLANT
ELECT REM PT RETURN 15FT ADLT (MISCELLANEOUS) ×3 IMPLANT
EVACUATOR SILICONE 100CC (DRAIN) ×3 IMPLANT
GAUZE SPONGE 2X2 8PLY STRL LF (GAUZE/BANDAGES/DRESSINGS) ×1 IMPLANT
GLOVE BIO SURGEON STRL SZ 6.5 (GLOVE) ×2 IMPLANT
GLOVE BIO SURGEONS STRL SZ 6.5 (GLOVE) ×1
GLOVE BIOGEL M STRL SZ7.5 (GLOVE) ×6 IMPLANT
GOWN STRL REUS W/TWL LRG LVL3 (GOWN DISPOSABLE) ×6 IMPLANT
HEMOSTAT SURGICEL 4X8 (HEMOSTASIS) ×3 IMPLANT
IRRIG SUCT STRYKERFLOW 2 WTIP (MISCELLANEOUS) ×3
IRRIGATION SUCT STRKRFLW 2 WTP (MISCELLANEOUS) ×1 IMPLANT
KIT BASIN OR (CUSTOM PROCEDURE TRAY) ×3 IMPLANT
KIT TURNOVER KIT A (KITS) IMPLANT
LOOP VESSEL MAXI BLUE (MISCELLANEOUS) ×3 IMPLANT
MARKER SKIN DUAL TIP RULER LAB (MISCELLANEOUS) ×3 IMPLANT
NEEDLE INSUFFLATION 14GA 120MM (NEEDLE) ×3 IMPLANT
NS IRRIG 1000ML POUR BTL (IV SOLUTION) ×3 IMPLANT
PORT ACCESS TROCAR AIRSEAL 12 (TROCAR) ×1 IMPLANT
PORT ACCESS TROCAR AIRSEAL 5M (TROCAR) ×2
POUCH SPECIMEN RETRIEVAL 10MM (ENDOMECHANICALS) ×3 IMPLANT
PROTECTOR NERVE ULNAR (MISCELLANEOUS) ×6 IMPLANT
RELOAD STAPLER WHITE 60MM (STAPLE) IMPLANT
SEAL CANN UNIV 5-8 DVNC XI (MISCELLANEOUS) ×4 IMPLANT
SEAL XI 5MM-8MM UNIVERSAL (MISCELLANEOUS) ×8
SET TRI-LUMEN FLTR TB AIRSEAL (TUBING) ×3 IMPLANT
SOLUTION ELECTROLUBE (MISCELLANEOUS) ×3 IMPLANT
SPONGE GAUZE 2X2 STER 10/PKG (GAUZE/BANDAGES/DRESSINGS) ×2
SPONGE LAP 4X18 RFD (DISPOSABLE) ×3 IMPLANT
STAPLE ECHEON FLEX 60 POW ENDO (STAPLE) IMPLANT
STAPLER RELOAD WHITE 60MM (STAPLE)
SURGIFLO W/THROMBIN 8M KIT (HEMOSTASIS) ×3 IMPLANT
SUT ETHILON 3 0 PS 1 (SUTURE) ×3 IMPLANT
SUT MNCRL AB 4-0 PS2 18 (SUTURE) ×6 IMPLANT
SUT PDS AB 1 CT1 27 (SUTURE) ×6 IMPLANT
SUT V-LOC BARB 180 2/0GR6 GS22 (SUTURE) ×3
SUT VIC AB 0 CT1 27 (SUTURE) ×8
SUT VIC AB 0 CT1 27XBRD ANTBC (SUTURE) ×4 IMPLANT
SUT VIC AB 2-0 SH 27 (SUTURE) ×4
SUT VIC AB 2-0 SH 27X BRD (SUTURE) ×2 IMPLANT
SUT VLOC BARB 180 ABS3/0GR12 (SUTURE) ×3
SUTURE V-LC BRB 180 2/0GR6GS22 (SUTURE) ×1 IMPLANT
SUTURE VLOC BRB 180 ABS3/0GR12 (SUTURE) ×1 IMPLANT
TOWEL OR 17X26 10 PK STRL BLUE (TOWEL DISPOSABLE) ×6 IMPLANT
TOWEL OR NON WOVEN STRL DISP B (DISPOSABLE) ×3 IMPLANT
TRAY FOLEY MTR SLVR 16FR STAT (SET/KITS/TRAYS/PACK) ×3 IMPLANT
TRAY LAPAROSCOPIC (CUSTOM PROCEDURE TRAY) ×3 IMPLANT
TROCAR BLADELESS OPT 5 100 (ENDOMECHANICALS) IMPLANT
TROCAR XCEL 12X100 BLDLESS (ENDOMECHANICALS) ×3 IMPLANT
WATER STERILE IRR 1000ML POUR (IV SOLUTION) ×6 IMPLANT

## 2018-06-02 NOTE — Brief Op Note (Signed)
06/02/2018  3:02 PM  PATIENT:  Cleatrice Burke  33 y.o. male  PRE-OPERATIVE DIAGNOSIS:  LEFT RENAL MASS  POST-OPERATIVE DIAGNOSIS:  LEFT RENAL MASS  PROCEDURE:  Procedure(s) with comments: XI ROBOTIC ASSITED PARTIAL NEPHRECTOMY (Left) - 3 HRS  SURGEON:  Surgeon(s) and Role:    Alexis Frock, MD - Primary  PHYSICIAN ASSISTANT:   ASSISTANTS: Clemetine Marker PA   ANESTHESIA:   local and general  EBL:  50 mL   BLOOD ADMINISTERED:none  DRAINS: JP to bulb   LOCAL MEDICATIONS USED:  MARCAINE     SPECIMEN:  Source of Specimen:  Left partial nephrectomy  DISPOSITION OF SPECIMEN:  PATHOLOGY  COUNTS:  YES  TOURNIQUET:  * No tourniquets in log *  DICTATION: .Other Dictation: Dictation Number  [id number]  PLAN OF CARE: Admit to inpatient   PATIENT DISPOSITION:  PACU - hemodynamically stable.   Delay start of Pharmacological VTE agent (>24hrs) due to surgical blood loss or risk of bleeding: yes

## 2018-06-02 NOTE — Discharge Instructions (Signed)

## 2018-06-02 NOTE — Transfer of Care (Signed)
Immediate Anesthesia Transfer of Care Note  Patient: Richard Brandt  Procedure(s) Performed: XI ROBOTIC ASSITED PARTIAL NEPHRECTOMY (Left )  Patient Location: PACU  Anesthesia Type:General  Level of Consciousness: awake, oriented, drowsy and patient cooperative  Airway & Oxygen Therapy: Patient Spontanous Breathing and Patient connected to face mask oxygen  Post-op Assessment: Report given to RN, Post -op Vital signs reviewed and stable and Patient moving all extremities  Post vital signs: Reviewed and stable  Last Vitals:  Vitals Value Taken Time  BP 158/86 06/02/2018  3:25 PM  Temp    Pulse 87 06/02/2018  3:27 PM  Resp 32 06/02/2018  3:27 PM  SpO2 100 % 06/02/2018  3:27 PM  Vitals shown include unvalidated device data.  Last Pain:  Vitals:   06/02/18 1020  TempSrc: Oral  PainSc: 0-No pain      Patients Stated Pain Goal: 3 ([id number])  Complications: No apparent anesthesia complications

## 2018-06-02 NOTE — Anesthesia Procedure Notes (Addendum)
Procedure Name: Intubation Date/Time: 06/02/2018 12:12 PM Performed by: Lynda Rainwater, MD Pre-anesthesia Checklist: Patient identified, Emergency Drugs available, Suction available and Patient being monitored Patient Re-evaluated:Patient Re-evaluated prior to induction Oxygen Delivery Method: Circle system utilized Preoxygenation: Pre-oxygenation with 100% oxygen Induction Type: IV induction Ventilation: Mask ventilation without difficulty Laryngoscope Size: Miller and 2 Grade View: Grade I Tube type: Oral Tube size: 7.5 mm Number of attempts: 1 Airway Equipment and Method: Stylet and Oral airway Placement Confirmation: ETT inserted through vocal cords under direct vision,  positive ETCO2 and breath sounds checked- equal and bilateral Tube secured with: Tape Dental Injury: Teeth and Oropharynx as per pre-operative assessment

## 2018-06-02 NOTE — H&P (Signed)
Richard Brandt is an 33 y.o. male.    Chief Complaint: Pre-Op LEFT partial nephrectomy  HPI:   1 - Gross Hematuria - few months of on / off visible blood in urine late 2019. Non-smoker. No chronic solvent exposure. Few stoens and ? left renal mass as per below. UA without infectious parameters. Cr 1.2 02/2018.   2 - Nephrolithiasis - passed small left ureteral stone late 2019 medically.  04/2018 - Left Ureteroscopy / tethered stent for multifocal renal stones to sonte free prior to planned partial nephrectomy. Composition 80% urate / 20% Struvite   3 -Left Renal Mass - 2.4cm calcificed parenchymanl density left lower pole by ER non-con CT 02-2018 concerning for possible small renal cancer. No h/o sickle disease / trait. Dedicated contrast scan 03/2018 with enhancing lower pole mass 90% endophytic. 1 artery / 2 vein left renovascular anatomy.   PMH sig for open appy. 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 NO CV disease / blood thinners.   Today "Richard Brandt" is seen to proceed with LEFT partial nephrectomy. No interval fevers or cough.     Past Medical History:  Diagnosis Date  . Elevated blood pressure reading    DENIES HX OF HTN, denies cardiac symptoms, endorses anxiety to get surgery over with   . History of kidney stones   . Left renal mass   . URI (upper respiratory infection)    dry cough last few months on amoicillian and prednisone; per patient resolved     Past Surgical History:  Procedure Laterality Date  . APPENDECTOMY  age 4 or 79  . CYSTOSCOPY WITH RETROGRADE PYELOGRAM, URETEROSCOPY AND STENT PLACEMENT Left 04/23/2018   Procedure: CYSTOSCOPY WITH RETROGRADE PYELOGRAM, URETEROSCOPY AND STENT PLACEMENT;  Surgeon: Alexis Frock, MD;  Location: [facility];  Service: Urology;  Laterality: Left;  . HOLMIUM LASER APPLICATION Left 11/18/4965   Procedure: HOLMIUM LASER APPLICATION;  Surgeon: Alexis Frock, MD;   Location: [facility];  Service: Urology;  Laterality: Left;    Family History  Problem Relation Age of Onset  . Diabetes Father   . Hypertension Other    Social History:  reports that he has never smoked. He has never used smokeless tobacco. He reports current alcohol use. He reports that he does not use drugs.  Allergies: No Known Allergies  No medications prior to admission.    No results found for this or any previous visit (from the past 48 hour(s)). No results found.  Review of Systems  Constitutional: Negative.   HENT: Negative.   Eyes: Negative.   Respiratory: Negative.   Cardiovascular: Negative.   Gastrointestinal: Negative.   Genitourinary: Negative.   Skin: Negative.   Neurological: Negative.   Endo/Heme/Allergies: Negative.     There were no vitals taken for this visit. Physical Exam  Eyes: Pupils are equal, round, and reactive to light.  Neck: Normal range of motion.  Cardiovascular: Normal rate.  Respiratory: Effort normal.  GI:  Stable truncal obesity  Musculoskeletal: Normal range of motion.  Neurological: He is alert.  Skin: Skin is warm.     Assessment/Plan  Proceed as planned with LEFT partial nephrectomy. Risks, benefits, alternatives, expected peri-op course discussed previously and reiterated today.  Alexis Frock, MD 06/02/2018, 8:32 AM

## 2018-06-02 NOTE — Anesthesia Postprocedure Evaluation (Signed)
Anesthesia Post Note  Patient: Richard Brandt  Procedure(s) Performed: XI ROBOTIC ASSITED PARTIAL NEPHRECTOMY (Left )     Patient location during evaluation: PACU Anesthesia Type: General Level of consciousness: awake and alert Pain management: pain level controlled Vital Signs Assessment: post-procedure vital signs reviewed and stable Respiratory status: spontaneous breathing, nonlabored ventilation and respiratory function stable Cardiovascular status: blood pressure returned to baseline and stable Postop Assessment: no apparent nausea or vomiting Anesthetic complications: no    Last Vitals:  Vitals:   06/02/18 1600 06/02/18 1615  BP: (!) 173/95 (!) 173/89  Pulse: 85 88  Resp: 14 12  Temp:    SpO2: 100% 100%    Last Pain:  Vitals:   06/02/18 1615  TempSrc:   PainSc: Asleep                 Lynda Rainwater

## 2018-06-02 NOTE — Anesthesia Preprocedure Evaluation (Signed)
Anesthesia Evaluation  Patient identified by MRN, date of birth, ID band Patient awake    Reviewed: Allergy & Precautions, NPO status , Patient's Chart, lab work & pertinent test results  Airway Mallampati: II  TM Distance: >3 FB Neck ROM: Full    Dental no notable dental hx. (+) Teeth Intact, Dental Advisory Given   Pulmonary neg pulmonary ROS,    Pulmonary exam normal breath sounds clear to auscultation       Cardiovascular Exercise Tolerance: Good negative cardio ROS Normal cardiovascular exam Rhythm:Regular Rate:Normal     Neuro/Psych negative neurological ROS  negative psych ROS   GI/Hepatic negative GI ROS,   Endo/Other    Renal/GU nephrolithiasis     Musculoskeletal   Abdominal (+) + obese,   Peds  Hematology   Anesthesia Other Findings Renal mass  Reproductive/Obstetrics                             Anesthesia Physical  Anesthesia Plan  ASA: II  Anesthesia Plan: General   Post-op Pain Management:    Induction: Intravenous  PONV Risk Score and Plan: 2 and Treatment may vary due to age or medical condition, Ondansetron and Midazolam  Airway Management Planned: Oral ETT  Additional Equipment:   Intra-op Plan:   Post-operative Plan: Extubation in OR  Informed Consent: I have reviewed the patients History and Physical, chart, labs and discussed the procedure including the risks, benefits and alternatives for the proposed anesthesia with the patient or authorized representative who has indicated his/her understanding and acceptance.     Dental advisory given  Plan Discussed with: CRNA  Anesthesia Plan Comments:         Anesthesia Quick Evaluation

## 2018-06-03 ENCOUNTER — Encounter (HOSPITAL_COMMUNITY): Payer: Self-pay | Admitting: Urology

## 2018-06-03 LAB — BASIC METABOLIC PANEL
Anion gap: 9 (ref 5–15)
BUN: 11 mg/dL (ref 6–20)
CALCIUM: 8.5 mg/dL — AB (ref 8.9–10.3)
CO2: 23 mmol/L (ref 22–32)
Chloride: 104 mmol/L (ref 98–111)
Creatinine, Ser: 1.1 mg/dL (ref 0.61–1.24)
GFR calc Af Amer: >60 mL/min (ref >60)
GFR calc non Af Amer: >60 mL/min (ref >60)
Glucose, Bld: 144 mg/dL — ABNORMAL HIGH (ref 70–99)
Potassium: 4.2 mmol/L (ref 3.5–5.1)
Sodium: 136 mmol/L (ref 135–145)

## 2018-06-03 LAB — HEMOGLOBIN AND HEMATOCRIT, BLOOD
HCT: 39.3 % (ref 39.0–52.0)
Hemoglobin: 12.8 g/dL — ABNORMAL LOW (ref 13.0–17.0)

## 2018-06-03 MED ORDER — SENNOSIDES-DOCUSATE SODIUM 8.6-50 MG PO TABS: 1.0000 | ORAL_TABLET | Freq: Two times a day (BID) | ORAL | 0 refills | Status: AC

## 2018-06-03 NOTE — Op Note (Signed)
NAMEAHMANI, PREHN MEDICAL RECORD [id number] ACCOUNT [id number] DATE OF BIRTH:07-16-1985 FACILITY: [facility] LOCATION: [facility] PHYSICIAN:Demaya Hardge, MD  OPERATIVE REPORT  DATE OF PROCEDURE:  06/02/2018  PREOPERATIVE DIAGNOSIS:  Solid left renal mass.  PROCEDURE:  Robotic-assisted laparoscopic left partial nephrectomy.  ESTIMATED BLOOD LOSS:  50 mL.  COMPLICATIONS:  None.  SPECIMEN:  Left partial nephrectomy.  FINDINGS: 1.  One hundred percent endophytic left inferolateral renal mass. 2.  One artery, very early branching vein, left renal vascular anatomy.  ASSISTANT:  Debbrah Alar, PA  DRAINS:  Jackson-Pratt drain to bulb suction.  INDICATIONS:  The patient is a very pleasant 33 year old gentleman who was found on workup for colicky flank pain to have a left proximal ureteral stone.  He also had an ipsilateral small renal mass.  He underwent dedicated contrast imaging of this  revealed an enhancing nearly 100% endophytic mass in the lower pole of the kidney.  He is Sales promotion account executive.  He does not have known sickle trait.  Oral constellation of findings worrisome for possible primary kidney cancer, even including medullary  histology.  Options were discussed for management, and he wished to proceed with addressing his ureteral stone, followed with partial nephrectomy in a staged fashion.  He underwent left ureteroscopy several weeks ago.  He is stone free.  He now presents  for left partial nephrectomy.  Informed consent was obtained and placed in the medical record.  PROCEDURE IN DETAIL:  The patient being identified and procedure being left robotic partial nephrectomy was confirmed.  Procedure timeout was performed.  IV antibiotics were administered.  General LMA anesthesia was induced.  The patient was placed into  a left-side-up full-flank position after a Foley catheter was placed free to straight drain, and his entire left flank was prepped using chlorhexidine gluconate  after clipper shaving and further fashioning of the table using 3-inch tape over foam padding  across the supraxiphoid chest and his pelvis.  Fifteen degrees of table flexion was used, the bottom leg bent, top leg straight, superior arm elevator and axillary roll.  Next, a high-flow, low-pressure pneumoperitoneum was obtained using Veress  technique in the left lower quadrant, having passed the aspiration and drop test.  An 8 mm robotic camera port was then placed in position approximately 4 fingerbreadths superolateral to the umbilicus.  Laparoscopic examination of the peritoneal cavity  revealed no significant adhesions, no visceral injury.  Additional ports were placed as follows:  Left subcostal 8 mm robotic port, left far lateral 8 mm robotic port approximately 4 fingerbreadths superior and medial to the anterior iliac spine, left  paramedian inferior robotic port approximately 1 handbreadth, superior pubic ramus and two 12 mm assistant port sites to midline, 1 in the supraumbilical crease and another 2 fingerbreadths above the camera port, AirSeal type.  Robot was docked and  passed the electronic checks.  Attention was directed at developing the retroperitoneum.  Incision was made lateral to the descending colon from the area of the splenic flexure towards the area of the internal ring.  It was carefully swept medially.  The  patient did have a significant amount of mesenteric fat, and the plane just anterior to the fascia was carefully chosen to help mobilize the colon medially and mobilize the mesenteric fat off of this.  Lateral splenic attachments were taken down,  allowing the spleen to rotate medially as well as the pancreas, splenic, and pancreatic vessels.  The lower pole of the kidney area was identified, placed on gentle lateral  traction.  Dissection proceeded medial to this.  The ureter and gonadal vessels  were encountered, also placed on gentle lateral traction, and dissection proceeded  within this triangle towards the renal hilum.  The hilum consisted of a very early branching broad-based renal vein and single artery, renal vascular anatomy as  anticipated.  The artery was circumferentially mobilized, marked with a vessel loop.  After the superior and inferior edge of the vein were mobilized, attention was directed to identification of the renal mass.  Dissection proceeded directly onto the  anterior-inferior parenchyma of the kidney, and the kidney was defatted down to the inferior and lateral portions.  Using a combination of CT scan for reference of visual cues, the area of the presumed tumor was defatted.  There were essentially no  obvious parenchymal changes in the area of suspected tumor.  Intraoperative ultrasound was then performed.  Intraoperative ultrasound revealed a heterogeneous solid mass of the inferior lateral aspect of the kidney with some echogenicity consistent with known calcifications.  This was greatly helpful in corroborating the location of the mass.  Using ultrasound  guidance, the superior, inferior, medial, and lateral aspects of the presumed plane of partial nephrectomy were marked at the parenchyma of the kidney.  Given the mass was essentially 100% endophytic, warm ischemia was then achieved by using 2 bulldog  clamps on the renal artery, and partial nephrectomy was performed using cold scissor down to what appeared to be normal-appearing renal parenchyma around the area of a solid mass.  There were no obvious entrances into the collecting system.  The partial  nephrectomy specimen was set aside for permanent pathology.  Deep aspect of this was set aside separately for permanent pathology as a final margin.  First layer renorrhaphy was performed using 3-0 V-Loc suture, reapproximating several small venous  sinuses.  A bolster of Surgicel was then applied, and 3 parenchymal apposition sutures of 0 Vicryl sandwiched between Hem-o-loks and Lapra-Tys were then  applied which resulted in excellent  hemostasis.  Bulldog clamps were removed in a warm ischemia,  and renal artery remained with excellent hemostasis.  FloSeal was then applied to the area of the renorrhaphy, and the perirenal fat was reapproximated using running 2-0 V-Loc to prevent expansile hematomas.  The vessel loop was removed off the renal  artery.  An additional small amount of FloSeal was applied to the other renal hilum.  The robot was then undocked.  A closed suction drain was brought up the previous lateral-most robotic port site near the peritoneal cavity.  The superior-most 12 mm  assistant port site was closed with fascia using Carter-Thomason suture passer and 0 Vicryl.  The inferior assistant port site was dilated to accommodate 2 surgeon's fingers, and the partial nephrectomy specimen was removed through this and set aside for  pathology.  This extraction site was closed with fascia using figure-of-eight PDS x2, followed by reapproximation of Scarpa's with running Vicryl.  All incision sites were infiltrated with dilute lipolyzed Marcaine and closed at the level of skin using  subcuticular Monocryl and Dermabond.  The procedure was then terminated.  The patient tolerated the procedure well.  No immediate perioperative complications.  The patient was taken to postanesthesia care in stable condition.  LN/NUANCE  D:06/02/2018 T:06/02/2018 JOB:[id number]

## 2018-06-03 NOTE — Progress Notes (Signed)
Patient c/o right chest and left should pain, denies any other symptoms, VSS, EKG shows NSR, on call for [facility] notified, no new orders obtained, prn pain medication given, pain has resolved, no new symptoms reported.

## 2018-06-03 NOTE — Discharge Summary (Signed)
Physician Discharge Summary  Patient ID: Richard Brandt MRN: [id number] DOB/AGE: [birth year] 33 y.o.  Admit date: 06/02/2018 Discharge date: 06/03/2018  Admission Diagnoses: Left Renal Mass  Discharge Diagnoses:  Active Problems:   Renal mass   Discharged Condition: good  Hospital Course: Pt underwent LEFT robotic partial nephrectomy with ultrasound on 06/02/18, the day of admission, without acute complications. He was admitted to 4th floor Urology service post-op. By the afternoon of POD 1 he is ambulatory, pain controlled on PO meds, maintaining PO hydration, voiding w/o catheter, and felt to be adequate for discharge. Hgb 12.8, final pathology pending at discharge.   Consults: None  Significant Diagnostic Studies: labs: as per above  Treatments: surgery: as per above  Discharge Exam: Blood pressure 132/74, pulse 68, temperature 98.6 F (37 C), temperature source Oral, resp. rate 20, height 6\' 8"  (2.032 m), weight 131 kg, SpO2 100 %. General appearance: alert, cooperative, appears stated age and fiancee at bedside Eyes: negative Nose: Nares normal. Septum midline. Mucosa normal. No drainage or sinus tenderness. Throat: lips, mucosa, and tongue normal; teeth and gums normal Neck: supple, symmetrical, trachea midline Back: symmetric, no curvature. ROM normal. No CVA tenderness. Resp: non-labored on room air.  Cardio: nl rate GI: soft, non-tender; bowel sounds normal; no masses,  no organomegaly and recent port sites c/d/i. JP removed as output scant and serosanguinous, dry dressing applied.  Male genitalia: normal Extremities: extremities normal, atraumatic, no cyanosis or edema Pulses: 2+ and symmetric Lymph nodes: Cervical, supraclavicular, and axillary nodes normal. Neurologic: Grossly normal Incision/Wound: as per above  Disposition: Discharge disposition: 01-Home or Self Care        Allergies as of 06/03/2018   No Known Allergies     Medication List    STOP  taking these medications   aspirin-acetaminophen-caffeine 250-250-65 MG tablet Commonly known as:  EXCEDRIN MIGRAINE   cephALEXin 500 MG capsule Commonly known as:  Keflex   ketorolac 10 MG tablet Commonly known as:  TORADOL     TAKE these medications   oxyCODONE-acetaminophen 5-325 MG tablet Commonly known as:  Percocet Take 1-2 tablets by mouth every 6 (six) hours as needed for moderate pain or severe pain. Post-operatively What changed:  reasons to take this   senna-docusate 8.6-50 MG tablet Commonly known as:  Senokot-S Take 1 tablet by mouth 2 (two) times daily. While taking strongest pain meds to prevent constipation. What changed:  Another medication with the same name was added. Make sure you understand how and when to take each.   senna-docusate 8.6-50 MG tablet Commonly known as:  Senokot-S Take 1 tablet by mouth 2 (two) times daily. While taking strong pain meds to prevent constipation What changed:  You were already taking a medication with the same name, and this prescription was added. Make sure you understand how and when to take each.      Follow-up Information    Alexis Frock, MD On 06/17/2018.   Specialty:  Urology Why:  at 10:00 Contact information: [address] [address] [phone]           Signed: Alexis Frock 06/03/2018, 2:11 PM

## 2018-06-04 LAB — TYPE AND SCREEN
ABO/RH(D): O POS
Antibody Screen: NEGATIVE
Unit division: 0
Unit division: 0

## 2018-06-04 LAB — BPAM RBC
Blood Product Expiration Date: 202004132359
Unit Type and Rh: 5100

## 2019-04-26 ENCOUNTER — Other Ambulatory Visit: Payer: Self-pay

## 2019-04-26 ENCOUNTER — Encounter (HOSPITAL_BASED_OUTPATIENT_CLINIC_OR_DEPARTMENT_OTHER): Payer: Self-pay | Admitting: *Deleted

## 2019-04-26 ENCOUNTER — Emergency Department (HOSPITAL_BASED_OUTPATIENT_CLINIC_OR_DEPARTMENT_OTHER)
Admission: EM | Admit: 2019-04-26 | Discharge: 2019-04-26 | Disposition: A | Discharge status: Home / Self Care | Payer: BLUE CROSS/BLUE SHIELD | Attending: Emergency Medicine | Admitting: Emergency Medicine

## 2019-04-26 DIAGNOSIS — S161XXA Strain of muscle, fascia and tendon at neck level, initial encounter: Secondary | ICD-10-CM | POA: Diagnosis not present

## 2019-04-26 DIAGNOSIS — Y9389 Activity, other specified: Secondary | ICD-10-CM | POA: Insufficient documentation

## 2019-04-26 DIAGNOSIS — M25519 Pain in unspecified shoulder: Secondary | ICD-10-CM | POA: Diagnosis not present

## 2019-04-26 DIAGNOSIS — X509XXA Other and unspecified overexertion or strenuous movements or postures, initial encounter: Secondary | ICD-10-CM | POA: Diagnosis not present

## 2019-04-26 DIAGNOSIS — Y9289 Other specified places as the place of occurrence of the external cause: Secondary | ICD-10-CM | POA: Diagnosis not present

## 2019-04-26 DIAGNOSIS — Y999 Unspecified external cause status: Secondary | ICD-10-CM | POA: Diagnosis not present

## 2019-04-26 DIAGNOSIS — M542 Cervicalgia: Secondary | ICD-10-CM | POA: Diagnosis present

## 2019-04-26 HISTORY — DX: Gout, unspecified: M10.9

## 2019-04-26 MED ORDER — KETOROLAC TROMETHAMINE 30 MG/ML IJ SOLN
30.0000 mg | Freq: Once | INTRAMUSCULAR | Status: AC
  Administered 2019-04-26: 15:00:00 30 mg via INTRAMUSCULAR

## 2019-04-26 MED ORDER — METHOCARBAMOL 500 MG PO TABS
500.0000 mg | ORAL_TABLET | Freq: Once | ORAL | Status: AC
  Administered 2019-04-26: 500 mg via ORAL
  Filled 2019-04-26: qty 1

## 2019-04-26 MED ORDER — METHOCARBAMOL 500 MG PO TABS: 500.0000 mg | ORAL_TABLET | Freq: Two times a day (BID) | ORAL | 0 refills | Status: AC

## 2019-04-26 MED ORDER — KETOROLAC TROMETHAMINE 30 MG/ML IJ SOLN
30.0000 mg | Freq: Once | INTRAMUSCULAR | Status: DC
  Filled 2019-04-26: qty 1

## 2019-04-26 NOTE — Discharge Instructions (Signed)
Take the muscle relaxer in addition to Tylenol and Aleve as needed. Return to the ED worsening pain, injuries or falls, numbness or weakness in your arms, chest pain or shortness of breath.

## 2019-04-26 NOTE — ED Triage Notes (Signed)
Pain in his neck and left shoulder for a week. Pain is worse at night when he lays down.

## 2019-04-26 NOTE — ED Provider Notes (Signed)
[facility] EMERGENCY DEPARTMENT Provider Note   CSN: [id number] Arrival date & time: 04/26/19  1403     History Chief Complaint  Patient presents with  . Neck Pain  . Shoulder Pain    Richard Brandt is a 34 y.o. male with a past medical history of gout presenting to the ED with a chief complaint of left-sided neck pain and shoulder pain.  States that he is concerned that he has "a crick in my neck."  He drives a forklift at work and is concerned that he worsened his pain with movement of his arm.  He has taken several doses of Aleve with only minimal improvement in his symptoms.  He did have an old shoulder injury which was nonoperative back in 2004 while playing basketball.  He is unsure if this could cause his pain today.  He denies any reinjury, numbness in arms or legs, chest pain, prior neck surgeries, paresthesias, weakness, cough, neck stiffness or fever.  HPI     Past Medical History:  Diagnosis Date  . Elevated blood pressure reading    DENIES HX OF HTN, denies cardiac symptoms, endorses anxiety to get surgery over with   . Gout   . History of kidney stones   . Left renal mass   . URI (upper respiratory infection)    dry cough last few months on amoicillian and prednisone; per patient resolved     Patient Active Problem List   Diagnosis Date Noted  . Renal mass 06/02/2018    Past Surgical History:  Procedure Laterality Date  . APPENDECTOMY  age 21 or 38  . CYSTOSCOPY WITH RETROGRADE PYELOGRAM, URETEROSCOPY AND STENT PLACEMENT Left 04/23/2018   Procedure: CYSTOSCOPY WITH RETROGRADE PYELOGRAM, URETEROSCOPY AND STENT PLACEMENT;  Surgeon: Alexis Frock, MD;  Location[facility];  Service: Urology;  Laterality: Left;  . HOLMIUM LASER APPLICATION Left [id number]   Procedure: HOLMIUM LASER APPLICATION;  Surgeon: Alexis Frock, MD;  Location: [facility];  Service: Urology;  Laterality: Left;  . ROBOTIC ASSITED PARTIAL NEPHRECTOMY  Left 06/02/2018   Procedure: XI ROBOTIC ASSITED PARTIAL NEPHRECTOMY;  Surgeon: Alexis Frock, MD;  Location: [facility];  Service: Urology;  Laterality: Left;  3 HRS       Family History  Problem Relation Age of Onset  . Diabetes Father   . Hypertension Other     Social History   Tobacco Use  . Smoking status: Never Smoker  . Smokeless tobacco: Never Used  Substance Use Topics  . Alcohol use: Yes    Comment: occ  . Drug use: No    Home Medications Prior to Admission medications   Medication Sig Start Date End Date Taking? Authorizing Provider  ALLOPURINOL PO Take by mouth.   Yes [provider]  methocarbamol (ROBAXIN) 500 MG tablet Take 1 tablet (500 mg total) by mouth 2 (two) times daily. 04/26/19   Teriyah Purington, PA-C  oxyCODONE-acetaminophen (PERCOCET) 5-325 MG tablet Take 1-2 tablets by mouth every 6 (six) hours as needed for moderate pain or severe pain. Post-operatively 06/02/18 06/02/19  Debbrah Alar, PA-C  senna-docusate (SENOKOT-S) 8.6-50 MG tablet Take 1 tablet by mouth 2 (two) times daily. While taking strongest pain meds to prevent constipation. Patient not taking: Reported on 05/20/2018 04/23/18   Alexis Frock, MD  senna-docusate (SENOKOT-S) 8.6-50 MG tablet Take 1 tablet by mouth 2 (two) times daily. While taking strong pain meds to prevent constipation 06/03/18   Alexis Frock, MD  Allergies    Patient has no known allergies.  Review of Systems   Review of Systems  Constitutional: Negative for chills and fever.  Eyes: Negative for visual disturbance.  Gastrointestinal: Negative for nausea and vomiting.  Musculoskeletal: Positive for myalgias and neck pain.  Skin: Negative for rash.  Neurological: Negative for weakness, numbness and headaches.    Physical Exam Updated Vital Signs BP (!) 155/89   Pulse 77   Temp 98.9 F (37.2 C) (Oral)   Resp 14   Ht 6\' 8"  (2.032 m)   Wt 131.1 kg   SpO2 100%   BMI 31.75 kg/m   Physical Exam Vitals and  nursing note reviewed.  Constitutional:      General: He is not in acute distress.    Appearance: He is well-developed. He is not diaphoretic.  HENT:     Head: Normocephalic and atraumatic.  Eyes:     General: No scleral icterus.    Conjunctiva/sclera: Conjunctivae normal.  Neck:      Comments: Tenderness to palpation of the right paraspinal musculature of the cervical spine without midline tenderness. Cardiovascular:     Rate and Rhythm: Normal rate and regular rhythm.     Heart sounds: Normal heart sounds.  Pulmonary:     Effort: Pulmonary effort is normal. No respiratory distress.     Breath sounds: Normal breath sounds.  Musculoskeletal:     Cervical back: Normal range of motion.     Comments: Full range of motion of bilateral shoulders.  Equal grip strength bilaterally.  Sensation intact to light touch of bilateral upper extremities.  2+ radial pulse palpated bilaterally.  Skin:    Findings: No rash.  Neurological:     Mental Status: He is alert.     ED Results / Procedures / Treatments   Labs (all labs ordered are listed, but only abnormal results are displayed) Labs Reviewed - No data to display  EKG None  Radiology No results found.  Procedures Procedures (including critical care time)  Medications Ordered in ED Medications  ketorolac (TORADOL) 30 MG/ML injection 30 mg (has no administration in time range)  methocarbamol (ROBAXIN) tablet 500 mg (has no administration in time range)    ED Course  I have reviewed the triage vital signs and the nursing notes.  Pertinent labs & imaging results that were available during my care of the patient were reviewed by me and considered in my medical decision making (see chart for details).    MDM Rules/Calculators/A&P                      34 year old male presenting to the ED with a chief complaint of left-sided neck and shoulder pain for the past week.  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  He has taken Aleve with minimal improvement.  On exam patient is overall well-appearing.  Tenderness to pressure in the paraspinal musculature without any neurological deficits.  Equal grip strength bilaterally.  He is overall well-appearing.  His vital signs are within normal limits.  He denies any prior neck surgeries, weakness or numbness.  Suspect that his symptoms are musculoskeletal in nature.  Doubt infectious or vascular cause of his symptoms as he is low risk without any preceding injury, trauma or abnormalities on his exam.  Will treat with anti-inflammatories, muscle relaxer and have him follow-up with PCP.  Patient  is hemodynamically stable, in NAD, and able to ambulate in the ED. Evaluation does not show pathology that would require ongoing emergent intervention or inpatient treatment. I explained the diagnosis to the patient. Pain has been managed and has no complaints prior to discharge. Patient is comfortable with above plan and is stable for discharge at this time. All questions were answered prior to disposition. Strict return precautions for returning to the ED were discussed. Encouraged follow up with PCP.   An After Visit Summary was printed and given to the patient.   Portions of this note were generated with Lobbyist. Dictation errors may occur despite best attempts at proofreading.  Final Clinical Impression(s) / ED Diagnoses Final diagnoses:  Acute strain of neck muscle, initial encounter    Rx / DC Orders ED Discharge Orders         Ordered    methocarbamol (ROBAXIN) 500 MG tablet  2 times daily     04/26/19 1459           Delia Heady, PA-C 04/26/19 1459    Lucrezia Starch, MD 04/28/19 0030

## 2020-03-24 IMAGING — CT CT RENAL STONE PROTOCOL
2 of 4 series · 16 of 46 positions shown, 18 images · non-contrast
Comparison: None.

CLINICAL DATA: Intermittent right lower quadrant abdominal pain
which started in the back and is now localized to the right abdomen.
Intermittent hematuria yesterday. History of kidney stone.

EXAM:
CT ABDOMEN AND PELVIS WITHOUT CONTRAST
TECHNIQUE: Multidetector CT imaging of the abdomen and pelvis was performed
following the standard protocol without IV contrast.

[Series 2: axial st · axial · 0.84mm/px · z∈[+1069,+1479]mm · 13 of 92 slices shown, 15 images]
[im 5/92  soft-tissue]
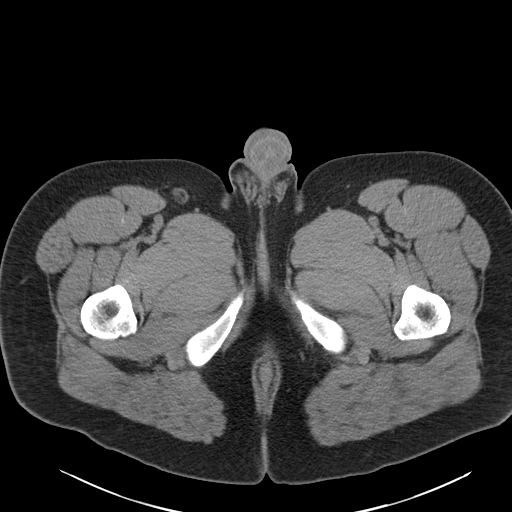
[im 5/92  bone]
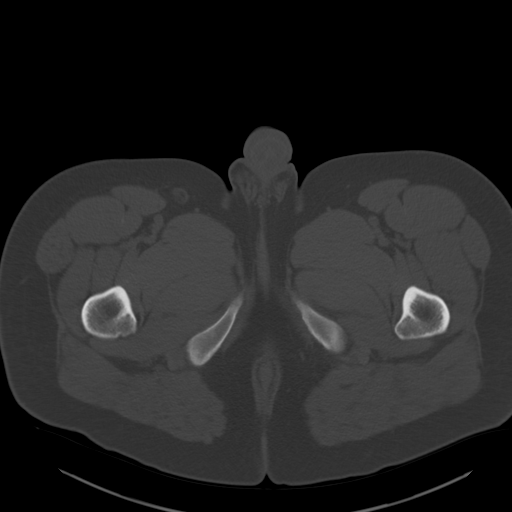
[im 14/92  soft-tissue]
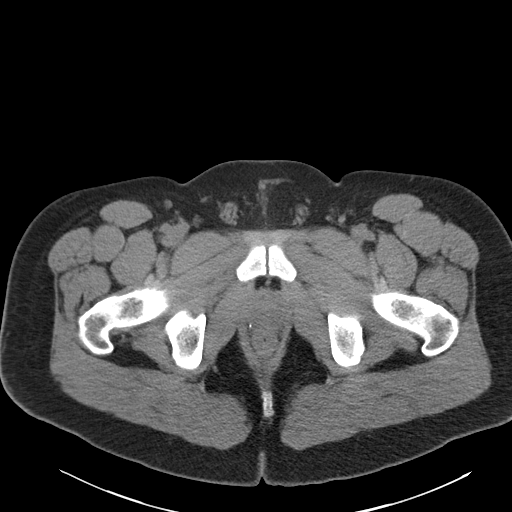
[im 18/92  soft-tissue]
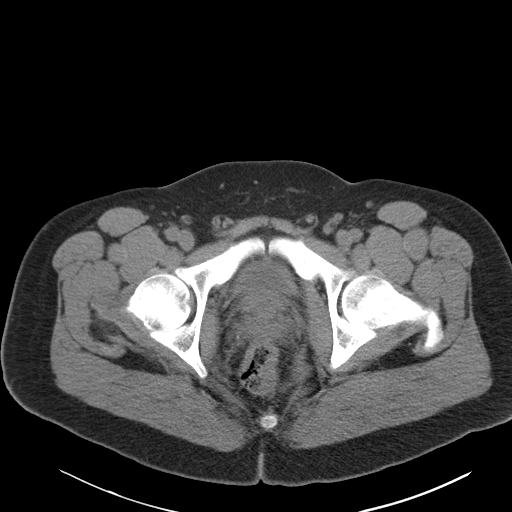
[im 27/92  soft-tissue]
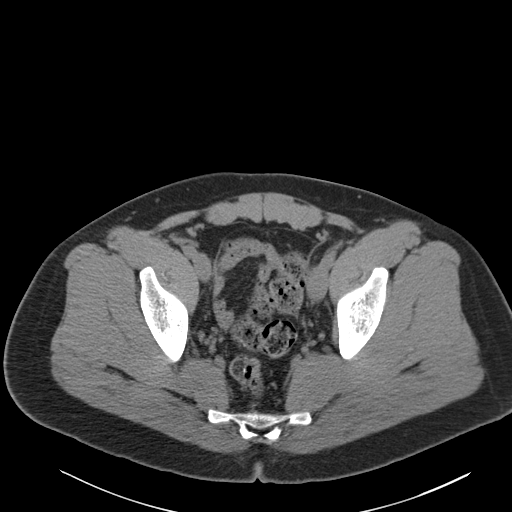
[im 31/92  soft-tissue]
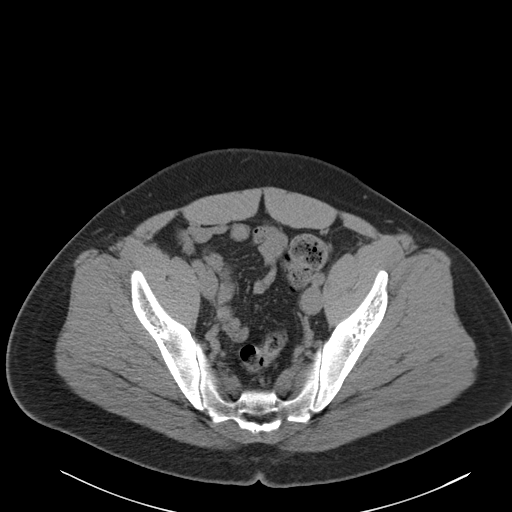
[im 40/92  soft-tissue]
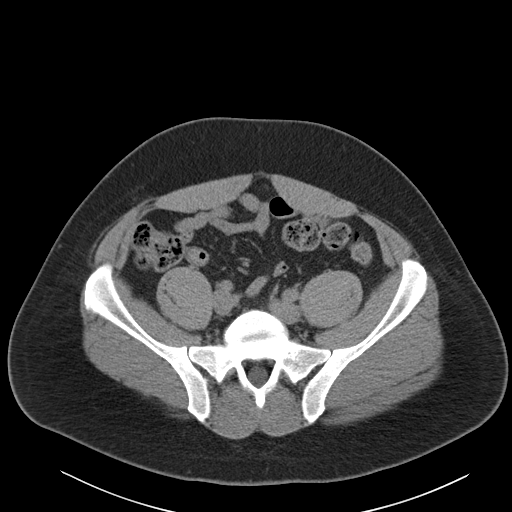
[im 48/92  soft-tissue]
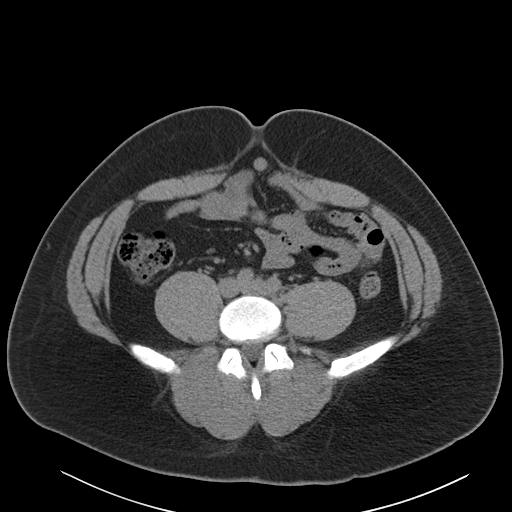
[im 53/92  soft-tissue]
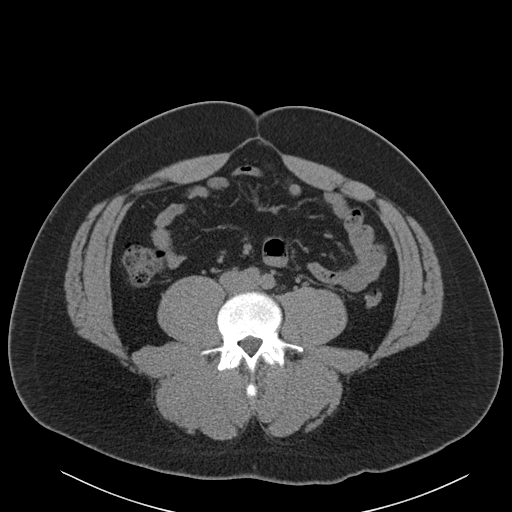
[im 61/92  soft-tissue]
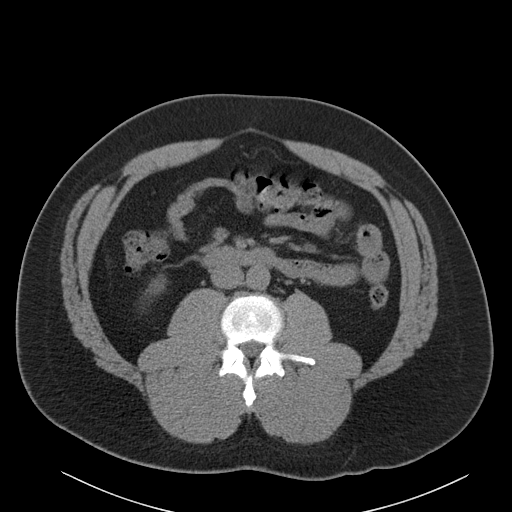
[im 61/92  bone]
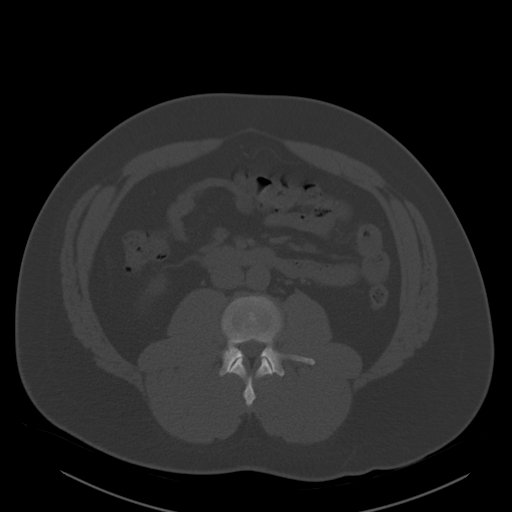
[im 66/92  soft-tissue]
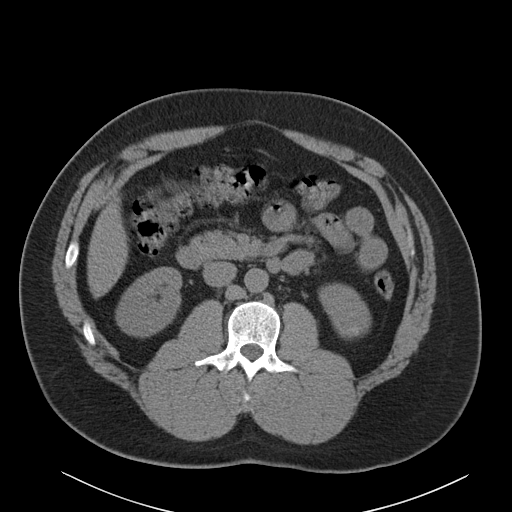
[im 74/92  soft-tissue]
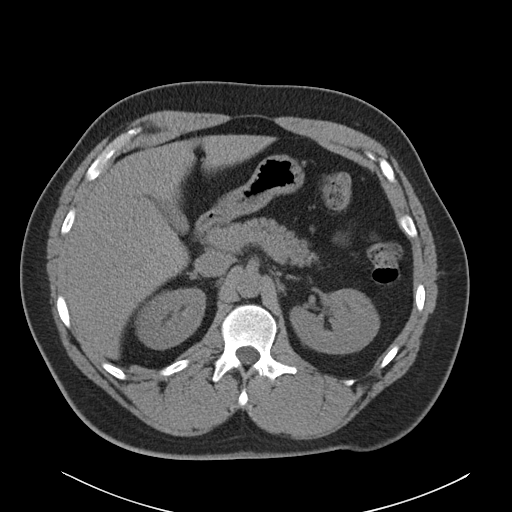
[im 79/92  soft-tissue]
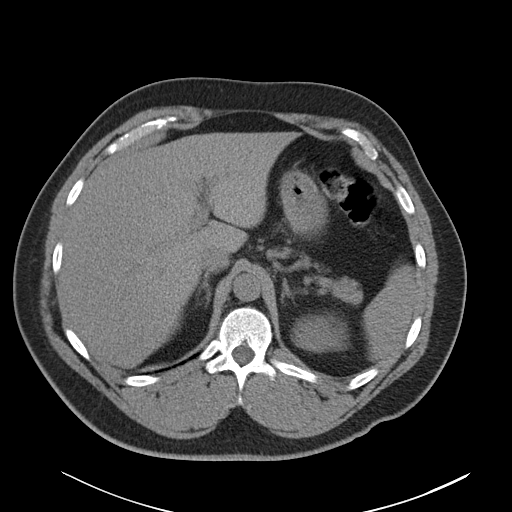
[im 87/92  soft-tissue]
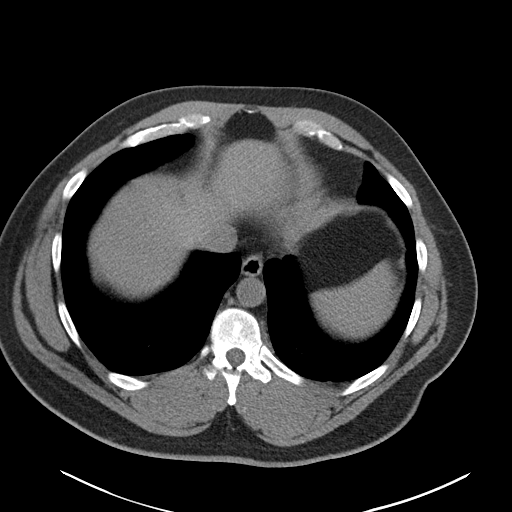

[Series 4: coronal · coronal · 0.95mm/px · 3 of 143 slices shown]
[im 48/143  soft-tissue]
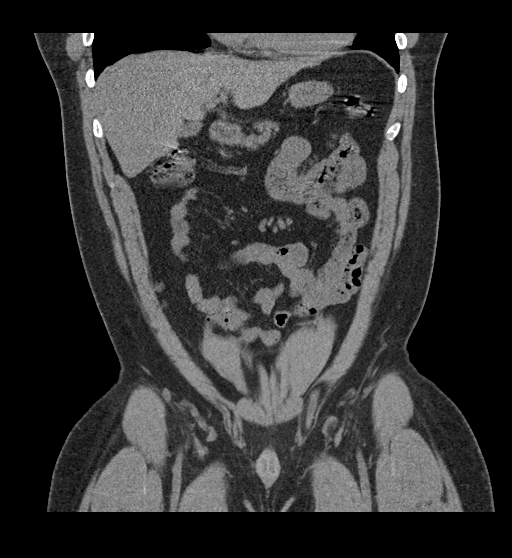
[im 64/143  soft-tissue]
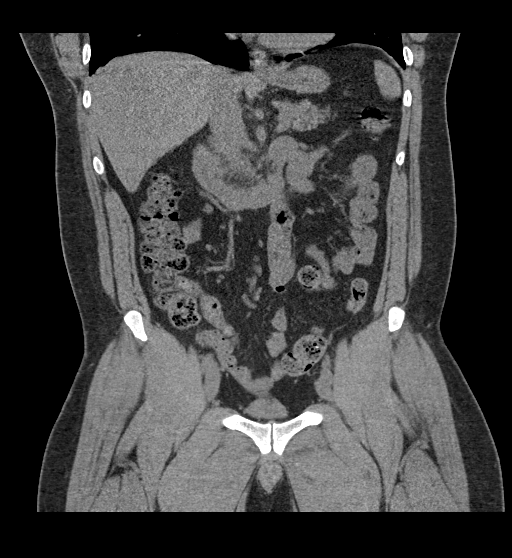
[im 79/143  soft-tissue]
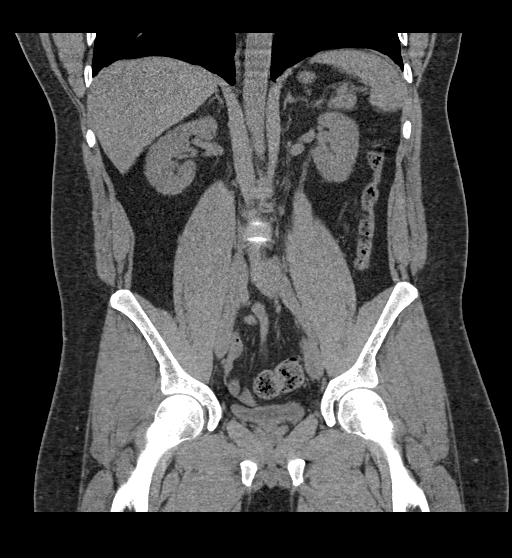

[16 of 46 positions shown; findings below may reference images not displayed]

FINDINGS: Lower chest: Clear lung bases. No significant pleural or pericardial
effusion.

Hepatobiliary: The liver appears unremarkable as imaged in the
noncontrast state. The gallbladder is incompletely distended. No
evidence of gallstones, gallbladder wall thickening or biliary
dilatation.

Pancreas: Unremarkable. No pancreatic ductal dilatation or
surrounding inflammatory changes.

Spleen: Normal in size without focal abnormality.

Adrenals/Urinary Tract: Both adrenal glands appear normal. The right
kidney appears unremarkable. There is a 10 mm calculus in the
interpolar region of the left kidney, possibly within an
infundibulum. There is another tiny calculus more inferiorly. In the
lower pole of the left kidney, there is suspicion of a peripherally
calcified lesion measuring 2.0 x 2.1 cm on image [DATE]. Both ureters
appear normal. The bladder is nearly empty and suboptimally
evaluated. There are bilateral pelvic phleboliths.

Stomach/Bowel: No evidence of bowel wall thickening, distention or
surrounding inflammatory change. The appendix is not visualized, by
report surgically absent. Mildly prominent stool throughout the
colon.

Vascular/Lymphatic: There are no enlarged abdominal or pelvic lymph
nodes. No significant vascular findings on noncontrast imaging.

Reproductive: The prostate gland and seminal vesicles appear normal.

Other: Small umbilical hernia containing only fat.  No ascites.

Musculoskeletal: No acute or significant osseous findings.
IMPRESSION: 1. No acute findings or explanation for the patient's symptoms.
2. Left renal calculi. No evidence of ureteral calculus or
hydronephrosis.
3. Indeterminate, suspected partially calcified lesion in the lower
pole of the left kidney, incompletely characterized by this
noncontrast study. This could reflect a complicated cyst or solid
mass. Urology consultation/follow-up recommended. Further evaluation
of this lesion would be best obtained with elective renal MRI to
include pre and post-contrast imaging. Alternatively, dedicated
renal CT could be performed.
4. Previous appendectomy.

## 2023-09-07 ENCOUNTER — Ambulatory Visit
Admission: EM | Admit: 2023-09-07 | Discharge: 2023-09-07 | Disposition: A | Discharge status: Home / Self Care | Attending: Family Medicine | Admitting: Family Medicine

## 2023-09-07 DIAGNOSIS — Z711 Person with feared health complaint in whom no diagnosis is made: Secondary | ICD-10-CM

## 2023-09-07 NOTE — ED Provider Notes (Signed)
 [facility]    CSN: [id number] Arrival date & time: 09/07/23  1607      History   Chief Complaint Chief Complaint  Patient presents with   Sore Throat    HPI Paeton Studer is a 38 y.o. male presents for throat concern.  Patient states couple days ago he was brushing his teeth and he looked in the back of his throat he saw something white and a couple of red spots prompting him to come in for evaluation.  He currently denies any sore throat, cough, fevers.  No pain with eating or drinking.  No throat or tongue swelling.  He does say has had a dry throat recently and has to clear his throat often otherwise no other concerns.   Sore Throat    Past Medical History:  Diagnosis Date   Elevated blood pressure reading    DENIES HX OF HTN, denies cardiac symptoms, endorses anxiety to get surgery over with    Gout    History of kidney stones    Left renal mass    URI (upper respiratory infection)    dry cough last few months on amoicillian and prednisone; per patient resolved     Patient Active Problem List   Diagnosis Date Noted   Renal mass 06/02/2018    Past Surgical History:  Procedure Laterality Date   APPENDECTOMY  age 29 or 14   CYSTOSCOPY WITH RETROGRADE PYELOGRAM, URETEROSCOPY AND STENT PLACEMENT Left 04/23/2018   Procedure: CYSTOSCOPY WITH RETROGRADE PYELOGRAM, URETEROSCOPY AND STENT PLACEMENT;  Surgeon: Alvaro Hummer, MD;  Location: [facility];  Service: Urology;  Laterality: Left;   HOLMIUM LASER APPLICATION Left 04/23/2018   Procedure: HOLMIUM LASER APPLICATION;  Surgeon: Alvaro Hummer, MD;  Location: [facility];  Service: Urology;  Laterality: Left;   ROBOTIC ASSITED PARTIAL NEPHRECTOMY Left 06/02/2018   Procedure: XI ROBOTIC ASSITED PARTIAL NEPHRECTOMY;  Surgeon: Alvaro Hummer, MD;  Location: [facility];  Service: Urology;  Laterality: Left;  3 HRS       Home Medications    Prior to Admission medications   Medication  Sig Start Date End Date Taking? Authorizing Provider  ALLOPURINOL PO Take by mouth.    [provider]  methocarbamol  (ROBAXIN ) 500 MG tablet Take 1 tablet (500 mg total) by mouth 2 (two) times daily. 04/26/19   Khatri, Hina, PA-C  senna-docusate (SENOKOT-S) 8.6-50 MG tablet Take 1 tablet by mouth 2 (two) times daily. While taking strongest pain meds to prevent constipation. Patient not taking: Reported on 05/20/2018 04/23/18   Alvaro Hummer KATHEE Mickey., MD  senna-docusate (SENOKOT-S) 8.6-50 MG tablet Take 1 tablet by mouth 2 (two) times daily. While taking strong pain meds to prevent constipation 06/03/18   Manny, Hummer KATHEE Mickey., MD    Family History Family History  Problem Relation Age of Onset   Diabetes Father    Hypertension Other     Social History Social History   Tobacco Use   Smoking status: Never   Smokeless tobacco: Never  Vaping Use   Vaping status: Never Used  Substance Use Topics   Alcohol use: Yes    Comment: occ   Drug use: No     Allergies   Patient has no known allergies.   Review of Systems Review of Systems  HENT:         Throat concern     Physical Exam Triage Vital Signs ED Triage Vitals  Encounter Vitals Group  BP --      Girls Systolic BP Percentile --      Girls Diastolic BP Percentile --      Boys Systolic BP Percentile --      Boys Diastolic BP Percentile --      Pulse Rate 09/07/23 1615 86     Resp 09/07/23 1615 16     Temp 09/07/23 1616 98.9 F (37.2 C)     Temp Source 09/07/23 1615 Oral     SpO2 09/07/23 1615 97 %     Weight --      Height --      Head Circumference --      Peak Flow --      Pain Score 09/07/23 1614 0     Pain Loc --      Pain Education --      Exclude from Growth Chart --    No data found.  Updated Vital Signs Pulse 86   Temp 98.9 F (37.2 C) (Oral)   Resp 16   SpO2 97%   Visual Acuity Right Eye Distance:   Left Eye Distance:   Bilateral Distance:    Right Eye Near:   Left Eye Near:     Bilateral Near:     Physical Exam Vitals and nursing note reviewed.  Constitutional:      General: He is not in acute distress.    Appearance: Normal appearance. He is not ill-appearing.  HENT:     Head: Normocephalic and atraumatic.     Mouth/Throat:     Mouth: Mucous membranes are moist.     Pharynx: Oropharynx is clear. Uvula midline. No pharyngeal swelling, oropharyngeal exudate, posterior oropharyngeal erythema, uvula swelling or postnasal drip.     Tonsils: No tonsillar exudate or tonsillar abscesses.   Eyes:     Pupils: Pupils are equal, round, and reactive to light.    Cardiovascular:     Rate and Rhythm: Normal rate.  Pulmonary:     Effort: Pulmonary effort is normal.   Skin:    General: Skin is warm and dry.   Neurological:     General: No focal deficit present.     Mental Status: He is alert and oriented to person, place, and time.   Psychiatric:        Mood and Affect: Mood normal.        Behavior: Behavior normal.      UC Treatments / Results  Labs (all labs ordered are listed, but only abnormal results are displayed) Labs Reviewed - No data to display  EKG   Radiology No results found.  Procedures Procedures (including critical care time)  Medications Ordered in UC Medications - No data to display  Initial Impression / Assessment and Plan / UC Course  I have reviewed the triage vital signs and the nursing notes.  Pertinent labs & imaging results that were available during my care of the patient were reviewed by me and considered in my medical decision making (see chart for details).     Discussed with patient exam is unremarkable and is consistent with normal oral mucosa.  He has no sore throat so we will defer strep testing.  Advised to do salt water  gargles for his dry throat and to follow-up with his PCP as needed. Final Clinical Impressions(s) / UC Diagnoses   Final diagnoses:  Feared condition not demonstrated   Discharge  Instructions   None    ED Prescriptions   None  PDMP not reviewed this encounter.   Loreda Myla SAUNDERS, NP 09/07/23 [phone]

## 2023-09-07 NOTE — ED Triage Notes (Signed)
 Pt present with c/o two red spots in the back of his throat. Pt states he sees a white spot on the back as well. Pt states his throat is not sore but feels it it is dry and has to clear it a lot.
